# Patient Record
Sex: Female | Born: 1995 | Race: Black or African American | Hispanic: No | Marital: Single | Smoking: Never smoker
Health system: Southern US, Community
[De-identification: ages and names within clinical notes are randomized; demographics above are authoritative.]

## PROBLEM LIST (undated history)

## (undated) DIAGNOSIS — R102 Pelvic and perineal pain: Secondary | ICD-10-CM

## (undated) DIAGNOSIS — Z9889 Other specified postprocedural states: Secondary | ICD-10-CM

## (undated) DIAGNOSIS — Z973 Presence of spectacles and contact lenses: Secondary | ICD-10-CM

## (undated) DIAGNOSIS — Z87820 Personal history of traumatic brain injury: Secondary | ICD-10-CM

## (undated) DIAGNOSIS — N9489 Other specified conditions associated with female genital organs and menstrual cycle: Secondary | ICD-10-CM

## (undated) HISTORY — PX: OTHER SURGICAL HISTORY: SHX169

## (undated) HISTORY — PX: PILONIDAL CYST EXCISION: SHX744

---
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
## 2014-08-27 HISTORY — PX: CYST REMOVAL TRUNK: SHX6283

## 2015-08-11 ENCOUNTER — Emergency Department (HOSPITAL_COMMUNITY)
Admission: EM | Admit: 2015-08-11 | Discharge: 2015-08-11 | Disposition: A | Discharge status: Home / Self Care | Payer: BC Managed Care – PPO | Attending: Emergency Medicine | Admitting: Emergency Medicine

## 2015-08-11 ENCOUNTER — Encounter (HOSPITAL_COMMUNITY): Payer: Self-pay | Admitting: *Deleted

## 2015-08-11 ENCOUNTER — Emergency Department (HOSPITAL_COMMUNITY): Payer: BC Managed Care – PPO

## 2015-08-11 DIAGNOSIS — R55 Syncope and collapse: Secondary | ICD-10-CM | POA: Diagnosis not present

## 2015-08-11 DIAGNOSIS — Z3202 Encounter for pregnancy test, result negative: Secondary | ICD-10-CM | POA: Diagnosis not present

## 2015-08-11 LAB — CBC WITH DIFFERENTIAL/PLATELET
BASOS ABS: 0.1 10*3/uL (ref 0.0–0.1)
BASOS PCT: 1 %
EOS ABS: 0.1 10*3/uL (ref 0.0–0.7)
Eosinophils Relative: 1 %
HCT: 36.7 % (ref 36.0–46.0)
HEMOGLOBIN: 12.3 g/dL (ref 12.0–15.0)
LYMPHS PCT: 25 %
Lymphs Abs: 2.1 10*3/uL (ref 0.7–4.0)
MCH: 31.5 pg (ref 26.0–34.0)
MCHC: 33.5 g/dL (ref 30.0–36.0)
MCV: 93.9 fL (ref 78.0–100.0)
MONO ABS: 0.5 10*3/uL (ref 0.1–1.0)
MONOS PCT: 6 %
NEUTROS ABS: 5.6 10*3/uL (ref 1.7–7.7)
NEUTROS PCT: 67 %
Platelets: 261 10*3/uL (ref 150–400)
RBC: 3.91 MIL/uL (ref 3.87–5.11)
RDW: 12.1 % (ref 11.5–15.5)
WBC: 8.3 10*3/uL (ref 4.0–10.5)

## 2015-08-11 LAB — BASIC METABOLIC PANEL
Anion gap: 10 (ref 5–15)
BUN: 15 mg/dL (ref 6–20)
CALCIUM: 9.6 mg/dL (ref 8.9–10.3)
CO2: 26 mmol/L (ref 22–32)
CREATININE: 1.02 mg/dL — AB (ref 0.44–1.00)
Chloride: 102 mmol/L (ref 101–111)
Glucose, Bld: 88 mg/dL (ref 65–99)
Potassium: 3.6 mmol/L (ref 3.5–5.1)
SODIUM: 138 mmol/L (ref 135–145)

## 2015-08-11 LAB — URINALYSIS, ROUTINE W REFLEX MICROSCOPIC
BILIRUBIN URINE: NEGATIVE
Glucose, UA: NEGATIVE mg/dL
KETONES UR: NEGATIVE mg/dL
Nitrite: NEGATIVE
PROTEIN: NEGATIVE mg/dL
Specific Gravity, Urine: 1.012 (ref 1.005–1.030)
UROBILINOGEN UA: 0.2 mg/dL (ref 0.0–1.0)
pH: 5.5 (ref 5.0–8.0)

## 2015-08-11 LAB — URINE MICROSCOPIC-ADD ON

## 2015-08-11 LAB — POC URINE PREG, ED: PREG TEST UR: NEGATIVE

## 2015-08-11 LAB — CBG MONITORING, ED: Glucose-Capillary: 100 mg/dL — ABNORMAL HIGH (ref 65–99)

## 2015-08-11 MED ORDER — METRONIDAZOLE 500 MG PO TABS
2000.0000 mg | ORAL_TABLET | Freq: Once | ORAL | Status: AC
  Administered 2015-08-11: 2000 mg via ORAL
  Filled 2015-08-11: qty 4

## 2015-08-11 NOTE — ED Notes (Signed)
CBG 100 

## 2015-08-11 NOTE — ED Notes (Signed)
Pt. States she has been \\having  blackouts. Pt. States she remembers everything but cannot remember if she hit her head. Pt. And friends are poor historians

## 2015-08-11 NOTE — Discharge Instructions (Signed)
Syncope Mariah Butler, you need to see a primary care physician within 3 days for close follow-up. If any symptoms worsen come back to the emergency department immediately. Thank you. Syncope means a person passes out (faints). The person usually wakes up in less than 5 minutes. It is important to seek medical care for syncope. HOME CARE  Have someone stay with you until you feel normal.  Do not drive, use machines, or play sports until your doctor says it is okay.  Keep all doctor visits as told.  Lie down when you feel like you might pass out. Take deep breaths. Wait until you feel normal before standing up.  Drink enough fluids to keep your pee (urine) clear or pale yellow.  If you take blood pressure or heart medicine, get up slowly. Take several minutes to sit and then stand. GET HELP RIGHT AWAY IF:   You have a severe headache.  You have pain in the chest, belly (abdomen), or back.  You are bleeding from the mouth or butt (rectum).  You have black or tarry poop (stool).  You have an irregular or very fast heartbeat.  You have pain with breathing.  You keep passing out, or you have shaking (seizures) when you pass out.  You pass out when sitting or lying down.  You feel confused.  You have trouble walking.  You have severe weakness.  You have vision problems. If you fainted, call for help (911 in [place] Do not drive yourself to the hospital.   This information is not intended to replace advice given to you by your health care provider. Make sure you discuss any questions you have with your health care provider.   Document Released: 03/31/2008 Document Revised: 02/27/2015 Document Reviewed: 12/12/2011 Elsevier Interactive Patient Education 2016 [employer]. Trichomoniasis Trichomoniasis is an infection caused by an organism called Trichomonas. The infection can affect both women and men. In women, the outer female genitalia and the vagina are affected. In men, the  penis is mainly affected, but the prostate and other reproductive organs can also be involved. Trichomoniasis is a sexually transmitted infection (STI) and is most often passed to another person through sexual contact.  RISK FACTORS  Having unprotected sexual intercourse.  Having sexual intercourse with an infected partner. SIGNS AND SYMPTOMS  Symptoms of trichomoniasis in women include:  Abnormal gray-green frothy vaginal discharge.  Itching and irritation of the vagina.  Itching and irritation of the area outside the vagina. Symptoms of trichomoniasis in men include:   Penile discharge with or without pain.  Pain during urination. This results from inflammation of the urethra. DIAGNOSIS  Trichomoniasis may be found during a Pap test or physical exam. Your health care provider may use one of the following methods to help diagnose this infection:  Testing the pH of the vagina with a test tape.  Using a vaginal swab test that checks for the Trichomonas organism. A test is available that provides results within a few minutes.  Examining a urine sample.  Testing vaginal secretions. Your health care provider may test you for other STIs, including HIV. TREATMENT   You may be given medicine to fight the infection. Women should inform their health care provider if they could be or are pregnant. Some medicines used to treat the infection should not be taken during pregnancy.  Your health care provider may recommend over-the-counter medicines or creams to decrease itching or irritation.  Your sexual partner will need to be treated if infected.  Your health care provider may test you for infection again 3 months after treatment. HOME CARE INSTRUCTIONS   Take medicines only as directed by your health care provider.  Take over-the-counter medicine for itching or irritation as directed by your health care provider.  Do not have sexual intercourse while you have the infection.  Women  should not douche or wear tampons while they have the infection.  Discuss your infection with your partner. Your partner may have gotten the infection from you, or you may have gotten it from your partner.  Have your sex partner get examined and treated if necessary.  Practice safe, informed, and protected sex.  See your health care provider for other STI testing. SEEK MEDICAL CARE IF:   You still have symptoms after you finish your medicine.  You develop abdominal pain.  You have pain when you urinate.  You have bleeding after sexual intercourse.  You develop a rash.  Your medicine makes you sick or makes you throw up (vomit). MAKE SURE YOU:  Understand these instructions.  Will watch your condition.  Will get help right away if you are not doing well or get worse.   This information is not intended to replace advice given to you by your health care provider. Make sure you discuss any questions you have with your health care provider.   Document Released: 04/08/2001 Document Revised: 11/03/2014 Document Reviewed: 07/25/2013 Elsevier Interactive Patient Education [employer].

## 2015-08-11 NOTE — ED Notes (Signed)
Pt. Left with all belongings and refused wheelchair. Discharge instructions were reviewed and all questions were answered.  

## 2015-08-11 NOTE — ED Provider Notes (Signed)
CSN: [id number]     Arrival date & time 08/11/15  4098 History   First MD Initiated Contact with Patient 08/11/15 0351     Chief Complaint  Patient presents with  . Loss of Consciousness     (Consider location/radiation/quality/duration/timing/severity/associated sxs/prior Treatment) HPI  Mariah Butler is a 19 y.o. female with no significant past medical history presenting today with syncope. Patient states this occurred tonight and has never happened in the past. She describes feeling weird while cooking today. She did not stand up from a sitting position prior to this event. Denies any prodromal pain such as chest pain, abdominal pain, back pain, or shortness of breath. At that time her friend provides the history states she slowly fell backward and caught herself prior to hitting the ground. Friend describes LOC for 40 seconds before coming back to. Patient's and states this occurred 2 more times tonight. She denies feeling ill recently, she is otherwise been in her normal state of health. Patient has no further complaints.  10 Systems reviewed and are negative for acute change except as noted in the HPI.       History reviewed. No pertinent past medical history. Past Surgical History  Procedure Laterality Date  . Cyst removed     History reviewed. No pertinent family history. Social History  Substance Use Topics  . Smoking status: Never Smoker   . Smokeless tobacco: None  . Alcohol Use: No   OB History    No data available     Review of Systems    Allergies  Review of patient's allergies indicates no known allergies.  Home Medications   Prior to Admission medications   Not on File   BP 123/77 mmHg  Pulse 80  Temp(Src) 98 F (36.7 C) (Oral)  Resp 13  Ht 5\' 5"  (1.651 m)  Wt 160 lb (72.576 kg)  BMI 26.63 kg/m2  SpO2 98%  LMP 07/28/2015 Physical Exam  Constitutional: She is oriented to person, place, and time. She appears well-developed and well-nourished.  No distress.  HENT:  Head: Normocephalic and atraumatic.  Nose: Nose normal.  Mouth/Throat: Oropharynx is clear and moist. No oropharyngeal exudate.  Eyes: Conjunctivae and EOM are normal. Pupils are equal, round, and reactive to light. No scleral icterus.  Neck: Normal range of motion. Neck supple. No JVD present. No tracheal deviation present. No thyromegaly present.  Cardiovascular: Normal rate, regular rhythm and normal heart sounds.  Exam reveals no gallop and no friction rub.   No murmur heard. Pulmonary/Chest: Effort normal and breath sounds normal. No respiratory distress. She has no wheezes. She exhibits no tenderness.  Abdominal: Soft. Bowel sounds are normal. She exhibits no distension and no mass. There is no tenderness. There is no rebound and no guarding.  Musculoskeletal: Normal range of motion. She exhibits no edema or tenderness.  Lymphadenopathy:    She has no cervical adenopathy.  Neurological: She is alert and oriented to person, place, and time. No cranial nerve deficit. She exhibits normal muscle tone.  Normal strength and sensation in all extremities. Normal cerebellar testing. Normal gait.  Skin: Skin is warm and dry. No rash noted. No erythema. No pallor.  Nursing note and vitals reviewed.   ED Course  Procedures (including critical care time) Labs Review Labs Reviewed  BASIC METABOLIC PANEL - Abnormal; Notable for the following:    Creatinine, Ser 1.02 (*)    All other components within normal limits  URINALYSIS, ROUTINE W REFLEX MICROSCOPIC (NOT AT [facility]) -  Abnormal; Notable for the following:    APPearance CLOUDY (*)    Hgb urine dipstick SMALL (*)    Leukocytes, UA MODERATE (*)    All other components within normal limits  URINE MICROSCOPIC-ADD ON - Abnormal; Notable for the following:    Squamous Epithelial / LPF FEW (*)    All other components within normal limits  CBG MONITORING, ED - Abnormal; Notable for the following:    Glucose-Capillary 100 (*)     All other components within normal limits  CBC WITH DIFFERENTIAL/PLATELET  POC URINE PREG, ED    Imaging Review No results found. I have personally reviewed and evaluated these images and lab results as part of my medical decision-making.   EKG Interpretation   Date/Time:  Saturday August 11 2015 04:03:14 EDT Ventricular Rate:  80 PR Interval:  140 QRS Duration: 89 QT Interval:  376 QTC Calculation: 434 R Axis:   50 Text Interpretation:  Sinus rhythm Confirmed by Glynn Octave  [phone]) on 08/11/2015 4:36:57 AM      MDM   Final diagnoses:  None   patient presents to the emergency department for evaluation for syncope. Given the history, I am not sure this is a true syncopal episode. Patient is able to guide himself onto the floor. I doubt seizure, there is no incontinence, no postictal state. EKG does not show any cause for syncope. Will also obtain chest x-ray and pregnancy test. She appears well in no acute distress, there is been no recurrence of her symptoms in the emergency department.  ED workup is negative. Urinalysis shows trichomonas, I will treat the patient with 2 g of Flagyl. Her vital signs remain within her normal limits and she is safe for discharge with primary care follow-up.  Everlene Balls, MD 08/11/15 [phone]

## 2015-10-28 DIAGNOSIS — Z9889 Other specified postprocedural states: Secondary | ICD-10-CM

## 2015-10-28 HISTORY — DX: Other specified postprocedural states: Z98.890

## 2016-05-15 ENCOUNTER — Ambulatory Visit (HOSPITAL_COMMUNITY)
Admission: EM | Admit: 2016-05-15 | Discharge: 2016-05-15 | Disposition: A | Discharge status: Home / Self Care | Payer: BC Managed Care – PPO | Attending: Emergency Medicine | Admitting: Emergency Medicine

## 2016-05-15 ENCOUNTER — Encounter (HOSPITAL_COMMUNITY): Payer: Self-pay | Admitting: Family Medicine

## 2016-05-15 DIAGNOSIS — N939 Abnormal uterine and vaginal bleeding, unspecified: Secondary | ICD-10-CM | POA: Insufficient documentation

## 2016-05-15 LAB — POCT PREGNANCY, URINE: PREG TEST UR: NEGATIVE

## 2016-05-15 NOTE — ED Provider Notes (Signed)
CSN: [id number]     Arrival date & time 05/15/16  1440 History   First MD Initiated Contact with Patient 05/15/16 1537     Chief Complaint  Patient presents with  . Vaginal Bleeding   (Consider location/radiation/quality/duration/timing/severity/associated sxs/prior Treatment) HPI Comments: 20 year old female presents today for abnormal uterine bleeding. She states her menses in June was on the sixth and right on time. Her next menses was July 4 with a six-day flow and right on time. She started having what she called heavy vaginal bleeding today. She has had to use a total of 2 tampons in total today. She also states that she has a vaginal discharge which is clear and sticky. Denies pelvic pain or odor. She is currently taking OCPs on a regular basis and has a PCP.   History reviewed. No pertinent past medical history. Past Surgical History  Procedure Laterality Date  . Cyst removed     History reviewed. No pertinent family history. Social History  Substance Use Topics  . Smoking status: Never Smoker   . Smokeless tobacco: None  . Alcohol Use: No   OB History    No data available     Review of Systems  Constitutional: Negative.   Respiratory: Negative.   Gastrointestinal: Negative.   Genitourinary: Positive for vaginal bleeding, vaginal discharge and menstrual problem. Negative for dysuria, urgency, frequency, hematuria, flank pain and pelvic pain.  Musculoskeletal: Negative.   Skin: Negative.   Neurological: Negative.   Psychiatric/Behavioral: Negative.   All other systems reviewed and are negative.   Allergies  Review of patient's allergies indicates no known allergies.  Home Medications   Prior to Admission medications   Not on File   Meds Ordered and Administered this Visit  Medications - No data to display  BP 119/67 mmHg  Pulse 66  Temp(Src) 98.5 F (36.9 C) (Oral)  Resp 16  SpO2 100%  LMP 05/15/2016 No data found.   Physical Exam  Constitutional:  She is oriented to person, place, and time. She appears well-developed and well-nourished. No distress.  Eyes: EOM are normal.  Neck: Normal range of motion. Neck supple.  Cardiovascular: Normal rate.   Pulmonary/Chest: Effort normal. No respiratory distress.  Genitourinary:  Normal external female genitalia. There is a slow flow of a small amount of blood from the cervical os. No other source of bleeding. No lesions are seen. The discharge in which the patient speaks is a clear mucoid fluid that appears to be physiologic spinn. No other abnormal discharge. No erythema or swelling. No cervical tenderness.  Musculoskeletal: She exhibits no edema.  Neurological: She is alert and oriented to person, place, and time. She exhibits normal muscle tone.  Skin: Skin is warm and dry.  Psychiatric: She has a normal mood and affect.  Nursing note and vitals reviewed.   ED Course  Procedures (including critical care time)  Labs Review Labs Reviewed  POCT PREGNANCY, URINE  CERVICOVAGINAL ANCILLARY ONLY   Results for orders placed or performed during the hospital encounter of 05/15/16  Pregnancy, urine POC  Result Value Ref Range   Preg Test, Ur NEGATIVE NEGATIVE     Imaging Review No results found.   Visual Acuity Review  Right Eye Distance:   Left Eye Distance:   Bilateral Distance:    Right Eye Near:   Left Eye Near:    Bilateral Near:         MDM   1. Abnormal uterine bleeding (AUB)    It  is likely that your bleeding is due to an imbalance of hormones. Call your doctor or nurse practitioner tomorrow for advice in regards to how to proceed. Suggestions may include taking more than one tablet a day for a few days to help stop the bleeding or stopping your pills altogether and taking another hormone for a few days. The bleeding may also just stopped on its own without other intervention. Cervical cytology pending.    Janne Napoleon, NP 05/15/16 1644

## 2016-05-15 NOTE — Discharge Instructions (Signed)
Abnormal Uterine Bleeding It is likely that your bleeding is due to an imbalance of hormones. Call your doctor or nurse practitioner tomorrow for advice in regards to how to proceed. Suggestions may include taking more than one tablet a day for a few days to help stop the bleeding or stopping your pills altogether and taking another hormone for a few days. The bleeding may also just stopped on its own without other intervention. Abnormal uterine bleeding can affect women at various stages in life, including teenagers, women in their reproductive years, pregnant women, and women who have reached menopause. Several kinds of uterine bleeding are considered abnormal, including:  Bleeding or spotting between periods.   Bleeding after sexual intercourse.   Bleeding that is heavier or more than normal.   Periods that last longer than usual.  Bleeding after menopause.  Many cases of abnormal uterine bleeding are minor and simple to treat, while others are more serious. Any type of abnormal bleeding should be evaluated by your health care provider. Treatment will depend on the cause of the bleeding. HOME CARE INSTRUCTIONS Monitor your condition for any changes. The following actions may help to alleviate any discomfort you are experiencing:  Avoid the use of tampons and douches as directed by your health care provider.  Change your pads frequently. You should get regular pelvic exams and Pap tests. Keep all follow-up appointments for diagnostic tests as directed by your health care provider.  SEEK MEDICAL CARE IF:   Your bleeding lasts more than 1 week.   You feel dizzy at times.  SEEK IMMEDIATE MEDICAL CARE IF:   You pass out.   You are changing pads every 15 to 30 minutes.   You have abdominal pain.  You have a fever.   You become sweaty or weak.   You are passing large blood clots from the vagina.   You start to feel nauseous and vomit. MAKE SURE YOU:   Understand  these instructions.  Will watch your condition.  Will get help right away if you are not doing well or get worse.   This information is not intended to replace advice given to you by your health care provider. Make sure you discuss any questions you have with your health care provider.   Document Released: 10/13/2005 Document Revised: 10/18/2013 Document Reviewed: 05/12/2013 Elsevier Interactive Patient Education [employer].

## 2016-05-15 NOTE — ED Notes (Signed)
Pt here for having an extra period this month. sts she had bleeding from the 4th to the 10th with cramping and now she is on her period again which is her normal cycle. Denies any pain now.

## 2016-05-16 LAB — CERVICOVAGINAL ANCILLARY ONLY
CHLAMYDIA, DNA PROBE: NEGATIVE
Neisseria Gonorrhea: NEGATIVE

## 2016-05-19 LAB — CERVICOVAGINAL ANCILLARY ONLY: Wet Prep (BD Affirm): POSITIVE — AB

## 2016-05-20 ENCOUNTER — Telehealth (HOSPITAL_COMMUNITY): Payer: Self-pay | Admitting: *Deleted

## 2016-05-20 ENCOUNTER — Telehealth: Payer: Self-pay | Admitting: Internal Medicine

## 2016-05-20 NOTE — Telephone Encounter (Signed)
Clinical staff, please let patient know that test for gardnerella (bacterial vaginosis) was positive.   Would send rx for metronidazole 500mg  bid x 7d, #14, no refills, to pharmacy.   Tests for gonorrhea/chlamydia were negative.  Recheck as needed if symptoms persist.  LM

## 2016-05-28 ENCOUNTER — Ambulatory Visit (HOSPITAL_COMMUNITY)
Admission: EM | Admit: 2016-05-28 | Discharge: 2016-05-28 | Disposition: A | Discharge status: Home / Self Care | Payer: BC Managed Care – PPO | Attending: Family Medicine | Admitting: Family Medicine

## 2016-05-28 ENCOUNTER — Encounter (HOSPITAL_COMMUNITY): Payer: Self-pay | Admitting: Nurse Practitioner

## 2016-05-28 DIAGNOSIS — A499 Bacterial infection, unspecified: Secondary | ICD-10-CM

## 2016-05-28 DIAGNOSIS — N760 Acute vaginitis: Secondary | ICD-10-CM | POA: Diagnosis not present

## 2016-05-28 DIAGNOSIS — B9689 Other specified bacterial agents as the cause of diseases classified elsewhere: Secondary | ICD-10-CM

## 2016-05-28 MED ORDER — METRONIDAZOLE 500 MG PO TABS: 500.0000 mg | ORAL_TABLET | Freq: Two times a day (BID) | ORAL | 0 refills | Status: AC

## 2016-05-28 NOTE — ED Triage Notes (Signed)
Pt states she is here for follow-up. She received a message in her Mychart account instructing her to come here for treatment of abnormal test results on her recent visit here for abnormal vaginal bleeding and discharge. Reports she continues to have abnormal vaginal discharge and pelvic cramps. She is alert and breathing easily

## 2016-05-29 NOTE — ED Provider Notes (Signed)
CSN: [id number]     Arrival date & time 05/28/16  1446 History   First MD Initiated Contact with Patient 05/29/16 1545     Chief Complaint  Patient presents with  . Follow-up   (Consider location/radiation/quality/duration/timing/severity/associated sxs/prior Treatment) Patient presents today to urgent care because she got a message through her Mychart Account instructing her to come due to an abnormal cervical cytology result. She was last seem on 05/15/16 here for vaginal discharge. Wet prep resulted on the 07/25 positive for Gardnerella (BV). A flagyl prescription was supposed to be called in but patient states she never got the prescription and when she called the Urgent care, she was instructed to come in for a visit. She continues to have clear white vaginal discharge.       History reviewed. No pertinent past medical history. Past Surgical History:  Procedure Laterality Date  . cyst removed     History reviewed. No pertinent family history. Social History  Substance Use Topics  . Smoking status: Never Smoker  . Smokeless tobacco: Never Used  . Alcohol use No   OB History    No data available     Review of Systems  Constitutional: Negative for chills, fatigue and fever.  Respiratory: Negative.   Cardiovascular: Negative.   Genitourinary: Positive for vaginal discharge. Negative for dysuria, vaginal bleeding and vaginal pain.    Allergies  Review of patient's allergies indicates no known allergies.  Home Medications   Prior to Admission medications   Medication Sig Start Date End Date Taking? Authorizing Provider  norethindrone-ethinyl estradiol (MICROGESTIN,JUNEL,LOESTRIN) 1-20 MG-MCG tablet Take 1 tablet by mouth daily.   Yes Historical Provider, MD  metroNIDAZOLE (FLAGYL) 500 MG tablet Take 1 tablet (500 mg total) by mouth 2 (two) times daily. 05/28/16 06/04/16  Barry Dienes, NP   Meds Ordered and Administered this Visit  Medications - No data to display  BP 107/63  (BP Location: Left Arm)   Pulse 62   Temp 98.8 F (37.1 C) (Oral)   Resp 18   Ht 5\' 5"  (1.651 m)   Wt 162 lb (73.5 kg)   LMP 04/29/2016   SpO2 100%   BMI 26.96 kg/m  No data found.   Physical Exam  Constitutional: She is oriented to person, place, and time. She appears well-developed and well-nourished.  Cardiovascular: Normal rate, regular rhythm and normal heart sounds.   Pulmonary/Chest: Effort normal and breath sounds normal.  Neurological: She is alert and oriented to person, place, and time.  Skin: Skin is warm and dry.    Urgent Care Course   Clinical Course    Procedures (including critical care time)  Labs Review Labs Reviewed - No data to display  Imaging Review No results found.   Visual Acuity Review  Right Eye Distance:   Left Eye Distance:   Bilateral Distance:    Right Eye Near:   Left Eye Near:    Bilateral Near:         MDM   1. Bacterial vaginosis    Patient does have a positive BV (positive for Gardnerella). Flagyl prescription given for 500mg  BID x 7 days. Instructed to follow up with PCP if she does not improve.    Barry Dienes, NP 05/29/16 [place], NP 05/29/16 1229

## 2017-02-20 ENCOUNTER — Encounter (HOSPITAL_COMMUNITY): Payer: Self-pay | Admitting: Emergency Medicine

## 2017-02-20 ENCOUNTER — Emergency Department (HOSPITAL_COMMUNITY): Payer: BC Managed Care – PPO

## 2017-02-20 ENCOUNTER — Emergency Department (HOSPITAL_COMMUNITY)
Admission: EM | Admit: 2017-02-20 | Discharge: 2017-02-20 | Disposition: A | Discharge status: Home / Self Care | Payer: BC Managed Care – PPO | Attending: Emergency Medicine | Admitting: Emergency Medicine

## 2017-02-20 DIAGNOSIS — N83202 Unspecified ovarian cyst, left side: Secondary | ICD-10-CM | POA: Insufficient documentation

## 2017-02-20 DIAGNOSIS — R1084 Generalized abdominal pain: Secondary | ICD-10-CM | POA: Diagnosis present

## 2017-02-20 DIAGNOSIS — Z79899 Other long term (current) drug therapy: Secondary | ICD-10-CM | POA: Insufficient documentation

## 2017-02-20 DIAGNOSIS — N83201 Unspecified ovarian cyst, right side: Secondary | ICD-10-CM | POA: Insufficient documentation

## 2017-02-20 DIAGNOSIS — D369 Benign neoplasm, unspecified site: Secondary | ICD-10-CM

## 2017-02-20 DIAGNOSIS — R1033 Periumbilical pain: Secondary | ICD-10-CM

## 2017-02-20 LAB — COMPREHENSIVE METABOLIC PANEL
ALBUMIN: 3.8 g/dL (ref 3.5–5.0)
ALK PHOS: 32 U/L — AB (ref 38–126)
ALT: 13 U/L — ABNORMAL LOW (ref 14–54)
ANION GAP: 12 (ref 5–15)
AST: 25 U/L (ref 15–41)
BUN: 16 mg/dL (ref 6–20)
CALCIUM: 9.3 mg/dL (ref 8.9–10.3)
CHLORIDE: 104 mmol/L (ref 101–111)
CO2: 22 mmol/L (ref 22–32)
Creatinine, Ser: 0.9 mg/dL (ref 0.44–1.00)
GFR calc Af Amer: >60 mL/min (ref >60)
GFR calc non Af Amer: >60 mL/min (ref >60)
GLUCOSE: 100 mg/dL — AB (ref 65–99)
POTASSIUM: 3.7 mmol/L (ref 3.5–5.1)
SODIUM: 138 mmol/L (ref 135–145)
Total Bilirubin: 0.8 mg/dL (ref 0.3–1.2)
Total Protein: 8 g/dL (ref 6.5–8.1)

## 2017-02-20 LAB — URINALYSIS, ROUTINE W REFLEX MICROSCOPIC
BILIRUBIN URINE: NEGATIVE
GLUCOSE, UA: NEGATIVE mg/dL
HGB URINE DIPSTICK: NEGATIVE
Ketones, ur: 20 mg/dL — AB
Leukocytes, UA: NEGATIVE
Nitrite: NEGATIVE
Protein, ur: NEGATIVE mg/dL
SPECIFIC GRAVITY, URINE: 1.031 — AB (ref 1.005–1.030)
pH: 6 (ref 5.0–8.0)

## 2017-02-20 LAB — I-STAT BETA HCG BLOOD, ED (MC, WL, AP ONLY): I-stat hCG, quantitative: <5 m[IU]/mL (ref <5)

## 2017-02-20 LAB — WET PREP, GENITAL
Sperm: NONE SEEN
TRICH WET PREP: NONE SEEN
YEAST WET PREP: NONE SEEN

## 2017-02-20 LAB — CBC
HEMATOCRIT: 37 % (ref 36.0–46.0)
HEMOGLOBIN: 13 g/dL (ref 12.0–15.0)
MCH: 32.8 pg (ref 26.0–34.0)
MCHC: 35.1 g/dL (ref 30.0–36.0)
MCV: 93.4 fL (ref 78.0–100.0)
Platelets: 284 10*3/uL (ref 150–400)
RBC: 3.96 MIL/uL (ref 3.87–5.11)
RDW: 11.9 % (ref 11.5–15.5)
WBC: 7.3 10*3/uL (ref 4.0–10.5)

## 2017-02-20 MED ORDER — ONDANSETRON HCL 4 MG/2ML IJ SOLN
4.0000 mg | Freq: Once | INTRAMUSCULAR | Status: AC
  Administered 2017-02-20: 4 mg via INTRAVENOUS
  Filled 2017-02-20: qty 2

## 2017-02-20 MED ORDER — IOPAMIDOL (ISOVUE-300) INJECTION 61%
INTRAVENOUS | Status: AC
  Administered 2017-02-20: 100 mL
  Filled 2017-02-20: qty 100

## 2017-02-20 MED ORDER — MORPHINE SULFATE (PF) 4 MG/ML IV SOLN
4.0000 mg | Freq: Once | INTRAVENOUS | Status: AC
  Administered 2017-02-20: 4 mg via INTRAVENOUS
  Filled 2017-02-20: qty 1

## 2017-02-20 MED ORDER — TRAMADOL HCL 50 MG PO TABS: 50.0000 mg | ORAL_TABLET | Freq: Four times a day (QID) | ORAL | 0 refills | Status: DC | PRN

## 2017-02-20 MED ORDER — KETOROLAC TROMETHAMINE 30 MG/ML IJ SOLN
30.0000 mg | Freq: Once | INTRAMUSCULAR | Status: AC
  Administered 2017-02-20: 30 mg via INTRAVENOUS
  Filled 2017-02-20: qty 1

## 2017-02-20 MED ORDER — SODIUM CHLORIDE 0.9 % IV BOLUS (SEPSIS)
500.0000 mL | Freq: Once | INTRAVENOUS | Status: AC
  Administered 2017-02-20: 500 mL via INTRAVENOUS

## 2017-02-20 NOTE — ED Notes (Signed)
US in process

## 2017-02-20 NOTE — ED Notes (Signed)
Pt verbalizes "pain is much better just hurts with certain movements."

## 2017-02-20 NOTE — ED Notes (Signed)
Pt transported to CT ?

## 2017-02-20 NOTE — Discharge Instructions (Signed)
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  Your ultrasound confirmed this and did not show ovarian torsion or decreased blood flow to your ovaries.  The pain you are experiencing is most likely from these dermoids. As a reminder, your gonorrhea and chlamydia testing are still pending.    Here are your CT scan and ultrasound findings: IMPRESSION:  Normal appendix.     Bilateral ovarian dermoids, left larger than right.     Small amount of free fluid in the pelvis, likely physiologic.   IMPRESSION:  1. Current Doppler assessment shows no compelling findings of  ovarian torsion. Arterial and venous waveforms are present in the  ovarian parenchyma at the time of scanning.  2. There is a small amount of free pelvic fluid.  3. Large hyperechoic left ovarian mass corresponding to the dermoid  seen at CT.  4. Two hyperechoic right ovarian lesions, 1 of which likely  represents the right ovarian dermoid seen at CT. I am not certain  about the second hyperechoic area on the right. This could be an  adjacent loop of bowel simulating a second lesion. I note that the  beta HCG level is normal, and accordingly ectopic pregnancy is not  suspected as a cause for the second area of hyperechogenicity.  Conceivably complex fluid in the right fallopian tube could cause a  similar appearance.    For pain: If your pain is mild you can take ibuprofen 600 mg every 6-8 hours or tylenol 1000 mg every 6-8 hours, separately.   If your pain is moderate you can take ibuprofen 600 mg + tylenol 1000 mg combined every 6-8 hours.  If your pain is severe you may take ibuprofen 600 mg + tylenol 1000 mg + tramadol 50 mg as prescribed  Contact the [facility] today and make an appointment as soon as possible for further discussion of your CT and ultrasound results, they may recommend more treatment or interventions.   Return to the emergency department if your abdominal pain  is severe or worsens and/or if it is associated with nausea, vomiting, vaginal bleeding.

## 2017-02-20 NOTE — ED Notes (Signed)
Pt back from CT; resting at present time with lights dimmed.

## 2017-02-20 NOTE — ED Triage Notes (Signed)
Per [facility] , pt.from her apartment with complaint of mid lower abdominal pain at 9/10 which started at 12 midnight. Pt. Also claimed of nausea , denied vomiting. Pt. Stated  her latest period was  a week and half ago but she still having bleeding up to this time. Pt. Stated that she's on birth control pill . Denied diarrhea.

## 2017-02-20 NOTE — ED Provider Notes (Signed)
[facility] DEPT Provider Note   CSN: [id number] Arrival date & time: 02/20/17  1540  History   Chief Complaint Chief Complaint  Patient presents with  . Abdominal Pain    HPI Mariah Butler is a 21 y.o. female with no pertinent pmh presents to ED with sudden onset, constant, "cramp" like generalized abdominal pain that radiates to the mid lower back associated with nausea that started last night during sleep.  LMP was 02/10/17, but patient notes prolonged painless vaginal spotting until yesterday.  Menstrual cycle usually lasts 4 days, and this month patient had vaginal spotting for 1.5 weeks.  Patient is on oral birth control pills and denies missed doses recently. Patient is sexually active with men only and reports consistent use of condom 100% of the time. No new sexual partners.  Patient denies previously diagnosed STD, last testing was December 2017 and she reports testing was negative.  Patient denies fevers, CP, SOB, vomiting, dysuria, hematuria, vaginal discharge or irritation, diarrhea or constipation.  Patient reports feeling well and at baseline yesterday.    Per chart review, patient had a GYN annual exam on December 2017 at PCP office. Her STD testing at that time was positive for chlamydia, negative for gonorrhea, syphilis and HIV. Patient was not aware of +chlamydia at that time.   HPI  History reviewed. No pertinent past medical history.  There are no active problems to display for this patient.   Past Surgical History:  Procedure Laterality Date  . cyst removed      OB History    No data available       Home Medications    Prior to Admission medications   Medication Sig Start Date End Date Taking? Authorizing Provider  ibuprofen (ADVIL,MOTRIN) 200 MG tablet Take 200 mg by mouth every 6 (six) hours as needed (pain, cramps).   Yes Historical Provider, MD  norethindrone-ethinyl estradiol (MICROGESTIN,JUNEL,LOESTRIN) 1-20 MG-MCG tablet Take 1 tablet by mouth  daily.   Yes Historical Provider, MD  traMADol (ULTRAM) 50 MG tablet Take 1 tablet (50 mg total) by mouth every 6 (six) hours as needed. 02/20/17   Kinnie Feil, PA-C    Family History History reviewed. No pertinent family history.  Social History Social History  Substance Use Topics  . Smoking status: Never Smoker  . Smokeless tobacco: Never Used  . Alcohol use No     Allergies   Patient has no known allergies.   Review of Systems Review of Systems  Constitutional: Negative for chills and fever.  HENT: Negative for congestion and sore throat.   Respiratory: Negative for cough and shortness of breath.   Cardiovascular: Negative for chest pain and palpitations.  Gastrointestinal: Positive for abdominal pain and nausea. Negative for blood in stool, constipation, diarrhea and vomiting.  Genitourinary: Positive for menstrual problem, pelvic pain and vaginal bleeding. Negative for decreased urine volume, difficulty urinating, dysuria, flank pain, frequency, hematuria, vaginal discharge and vaginal pain.  Musculoskeletal: Positive for back pain (mid lower back).  Skin: Positive for rash.  Neurological: Negative for light-headedness and headaches.     Physical Exam Updated Vital Signs BP 115/83 (BP Location: Right Arm)   Pulse (!) 57   Temp 98.3 F (36.8 C) (Oral)   Resp 12   LMP 02/09/2017 Comment: pt. on birth control pill  SpO2 100%   Physical Exam  Constitutional: She is oriented to person, place, and time. She appears well-developed and well-nourished.  Teary eyed, reports 9/10 abdominal pain  HENT:  Head: Normocephalic and atraumatic.  Nose: Nose normal.  Mouth/Throat: Oropharynx is clear and moist. No oropharyngeal exudate.  Moist mucous membranes  Eyes: Conjunctivae are normal. Pupils are equal, round, and reactive to light.  Neck: Normal range of motion. Neck supple.  Cardiovascular: Normal rate, regular rhythm, normal heart sounds and intact distal pulses.    No murmur heard. Pulmonary/Chest: Effort normal and breath sounds normal. No respiratory distress. She has no wheezes. She has no rales. She exhibits no tenderness.  Abdominal: Soft. Bowel sounds are normal. She exhibits no distension and no mass. There is tenderness. There is no rebound and no guarding.  +Suprapubic and periumbilical abdominal tenderness No guarding, distention, rebound or rigidity No signs of peritonitis  No CVAT bilaterally Negative McBurney's Negative Murphy's  Negative Psoas's Negative' Obturator's  Genitourinary: Cervix exhibits motion tenderness.  Genitourinary Comments:  +Scant light yellow, think cervical discharge +CMT External genitalia normal without erythema, edema, tenderness, discharge or lesions.  No groin lymphadenopathy.  Vaginal mucosa and cervix normal, pink without lesions.  Non palpable adnexa  Musculoskeletal: Normal range of motion. She exhibits no deformity.  Lymphadenopathy:    She has no cervical adenopathy.  Neurological: She is alert and oriented to person, place, and time. No sensory deficit.  Skin: Skin is warm and dry. Capillary refill takes less than 2 seconds.  Psychiatric: She has a normal mood and affect. Her behavior is normal. Judgment and thought content normal.  Nursing note and vitals reviewed.    ED Treatments / Results  Labs (all labs ordered are listed, but only abnormal results are displayed) Labs Reviewed  WET PREP, GENITAL - Abnormal; Notable for the following:       Result Value   Clue Cells Wet Prep HPF POC PRESENT (*)    WBC, Wet Prep HPF POC MANY (*)    All other components within normal limits  COMPREHENSIVE METABOLIC PANEL - Abnormal; Notable for the following:    Glucose, Bld 100 (*)    ALT 13 (*)    Alkaline Phosphatase 32 (*)    All other components within normal limits  URINALYSIS, ROUTINE W REFLEX MICROSCOPIC - Abnormal; Notable for the following:    APPearance HAZY (*)    Specific Gravity,  Urine 1.031 (*)    Ketones, ur 20 (*)    All other components within normal limits  CBC  I-STAT BETA HCG BLOOD, ED ([facility], [facility], [facility] ONLY)  GC/CHLAMYDIA PROBE AMP ([facility]) NOT AT [facility]    EKG  EKG Interpretation None       Radiology US Transvaginal Non-ob  Result Date: 02/20/2017 CLINICAL DATA:  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 Pelvic pain. EXAM: TRANSABDOMINAL AND TRANSVAGINAL ULTRASOUND OF PELVIS DOPPLER ULTRASOUND OF OVARIES TECHNIQUE: Both transabdominal and transvaginal ultrasound examinations of the pelvis were performed. Transabdominal technique was performed for global imaging of the pelvis including uterus, ovaries, adnexal regions, and pelvic cul-de-sac. It was necessary to proceed with endovaginal exam following the transabdominal exam to visualize the uterus, endometrium, and ovaries. Color and duplex Doppler ultrasound was utilized to evaluate blood flow to the ovaries. COMPARISON:  02/20/2017 CT scan FINDINGS: Uterus Measurements: 8.2 by 4.5 by 4.9 cm. No fibroids or other mass visualized. Endometrium Thickness: 6 mm.  No focal abnormality visualized. Right ovary Measurements: 7.3 by 3.4 by 4.0 cm. There are 2 hyperechoic areas along the right ovary, 1 measuring 3.3 by 2.8 by 4.0 cm and the other 2.2 by 3.1 by 2.7 cm.  Left ovary Measurements: 7.7 by 7.1 by 8.0 cm. The left ovary is primarily made up of a large hyperechoic lesion compatible with the dermoid shown on CT. Pulsed Doppler evaluation of both ovaries demonstrates normal low-resistance arterial and venous waveforms within the recognizable ovarian parenchyma. Other findings Small amount of free pelvic fluid. IMPRESSION: 1. Current Doppler assessment shows no compelling findings of ovarian torsion. Arterial and venous waveforms are present in the ovarian parenchyma at the time of scanning. 2. There is a small amount of free pelvic fluid. 3. Large hyperechoic left ovarian mass corresponding to the  dermoid seen at CT. 4. Two hyperechoic right ovarian lesions, 1 of which likely represents the right ovarian dermoid seen at CT. I am not certain about the second hyperechoic area on the right. This could be an adjacent loop of bowel simulating a second lesion. I note that the beta HCG level is normal, and accordingly ectopic pregnancy is not suspected as a cause for the second area of hyperechogenicity. Conceivably complex fluid in the right fallopian tube could cause a similar appearance. Electronically Signed   By: Van Clines M.D.   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 Pelvic pain. EXAM: TRANSABDOMINAL AND TRANSVAGINAL ULTRASOUND OF PELVIS DOPPLER ULTRASOUND OF OVARIES TECHNIQUE: Both transabdominal and transvaginal ultrasound examinations of the pelvis were performed. Transabdominal technique was performed for global imaging of the pelvis including uterus, ovaries, adnexal regions, and pelvic cul-de-sac. It was necessary to proceed with endovaginal exam following the transabdominal exam to visualize the uterus, endometrium, and ovaries. Color and duplex Doppler ultrasound was utilized to evaluate blood flow to the ovaries. COMPARISON:  02/20/2017 CT scan FINDINGS: Uterus Measurements: 8.2 by 4.5 by 4.9 cm. No fibroids or other mass visualized. Endometrium Thickness: 6 mm.  No focal abnormality visualized. Right ovary Measurements: 7.3 by 3.4 by 4.0 cm. There are 2 hyperechoic areas along the right ovary, 1 measuring 3.3 by 2.8 by 4.0 cm and the other 2.2 by 3.1 by 2.7 cm. Left ovary Measurements: 7.7 by 7.1 by 8.0 cm. The left ovary is primarily made up of a large hyperechoic lesion compatible with the dermoid shown on CT. Pulsed Doppler evaluation of both ovaries demonstrates normal low-resistance arterial and venous waveforms within the recognizable ovarian parenchyma. Other findings Small  amount of free pelvic fluid. IMPRESSION: 1. Current Doppler assessment shows no compelling findings of ovarian torsion. Arterial and venous waveforms are present in the ovarian parenchyma at the time of scanning. 2. There is a small amount of free pelvic fluid. 3. Large hyperechoic left ovarian mass corresponding to the dermoid seen at CT. 4. Two hyperechoic right ovarian lesions, 1 of which likely represents the right ovarian dermoid seen at CT. I am not certain about the second hyperechoic area on the right. This could be an adjacent loop of bowel simulating a second lesion. I note that the beta HCG level is normal, and accordingly ectopic pregnancy is not suspected as a cause for the second area of hyperechogenicity. Conceivably complex fluid in the right fallopian tube could cause a similar appearance. Electronically Signed   By: Van Clines M.D.   On: 02/20/2017 13:02   Ct Abdomen Pelvis W Contrast  Result Date: 02/20/2017 CLINICAL DATA:  Periumbilical abdominal pain, nausea EXAM: CT ABDOMEN AND PELVIS WITH CONTRAST TECHNIQUE: Multidetector CT imaging of the abdomen and pelvis was performed using the standard protocol following  bolus administration of intravenous contrast. CONTRAST:  <See Chart> ISOVUE-300 IOPAMIDOL (ISOVUE-300) INJECTION 61% COMPARISON:  None. FINDINGS: Lower chest: Lung bases are clear. No effusions. Heart is normal size. Hepatobiliary: No focal hepatic abnormality. Gallbladder unremarkable. Pancreas: No focal abnormality or ductal dilatation. Spleen: No focal abnormality.  Normal size. Adrenals/Urinary Tract: No adrenal abnormality. No focal renal abnormality. No stones or hydronephrosis. Urinary bladder is unremarkable. Stomach/Bowel: Appendix is normal. Moderate stool burden in the cecum and ascending colon. Stomach, large and small bowel grossly unremarkable. Vascular/Lymphatic: No evidence of aneurysm or adenopathy. Reproductive: Uterus is unremarkable. Large fatty mass noted  in the left ovary with internal soft tissue and calcifications compatible with dermoid. This measures 7.7 x 4.9 cm Smaller fat containing dermoid noted in the right ovary measuring 2.3 x 2.1 cm. Other: Small amount of free fluid in the pelvis.  No free air. Musculoskeletal: No acute bony abnormality. IMPRESSION: Normal appendix. Bilateral ovarian dermoids, left larger than right. Small amount of free fluid in the pelvis, likely physiologic. Electronically Signed   By: Rolm Baptise M.D.   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 Pelvic pain. EXAM: TRANSABDOMINAL AND TRANSVAGINAL ULTRASOUND OF PELVIS DOPPLER ULTRASOUND OF OVARIES TECHNIQUE: Both transabdominal and transvaginal ultrasound examinations of the pelvis were performed. Transabdominal technique was performed for global imaging of the pelvis including uterus, ovaries, adnexal regions, and pelvic cul-de-sac. It was necessary to proceed with endovaginal exam following the transabdominal exam to visualize the uterus, endometrium, and ovaries. Color and duplex Doppler ultrasound was utilized to evaluate blood flow to the ovaries. COMPARISON:  02/20/2017 CT scan FINDINGS: Uterus Measurements: 8.2 by 4.5 by 4.9 cm. No fibroids or other mass visualized. Endometrium Thickness: 6 mm.  No focal abnormality visualized. Right ovary Measurements: 7.3 by 3.4 by 4.0 cm. There are 2 hyperechoic areas along the right ovary, 1 measuring 3.3 by 2.8 by 4.0 cm and the other 2.2 by 3.1 by 2.7 cm. Left ovary Measurements: 7.7 by 7.1 by 8.0 cm. The left ovary is primarily made up of a large hyperechoic lesion compatible with the dermoid shown on CT. Pulsed Doppler evaluation of both ovaries demonstrates normal low-resistance arterial and venous waveforms within the recognizable ovarian parenchyma. Other findings Small amount of free pelvic fluid. IMPRESSION: 1.  Current Doppler assessment shows no compelling findings of ovarian torsion. Arterial and venous waveforms are present in the ovarian parenchyma at the time of scanning. 2. There is a small amount of free pelvic fluid. 3. Large hyperechoic left ovarian mass corresponding to the dermoid seen at CT. 4. Two hyperechoic right ovarian lesions, 1 of which likely represents the right ovarian dermoid seen at CT. I am not certain about the second hyperechoic area on the right. This could be an adjacent loop of bowel simulating a second lesion. I note that the beta HCG level is normal, and accordingly ectopic pregnancy is not suspected as a cause for the second area of hyperechogenicity. Conceivably complex fluid in the right fallopian tube could cause a similar appearance. Electronically Signed   By: Van Clines M.D.   On: 02/20/2017 13:02    Procedures Procedures (including critical care time)  Medications Ordered in ED Medications  sodium chloride 0.9 % bolus 500 mL (0 mLs Intravenous Stopped 02/20/17 0830)  ondansetron (ZOFRAN) injection 4 mg (4 mg Intravenous Given 02/20/17 0803)  ketorolac (TORADOL) 30 MG/ML injection 30  mg (30 mg Intravenous Given 02/20/17 0803)  morphine 4 MG/ML injection 4 mg (4 mg Intravenous Given 02/20/17 0855)  iopamidol (ISOVUE-300) 61 % injection (100 mLs  Contrast Given 02/20/17 1014)     Initial Impression / Assessment and Plan / ED Course  I have reviewed the triage vital signs and the nursing notes.  Pertinent labs & imaging results that were available during my care of the patient were reviewed by me and considered in my medical decision making (see chart for details).  Clinical Course as of Feb 20 1405  Fri Feb 20, 2017  1056 CT Abdomen Pelvis W Contrast [CG]  1057 Temp: 98.3 F (36.8 C) [CG]  1057 Pulse Rate: 83 [CG]  1057 BP: 116/80 [CG]  1057 Resp: 18 [CG]  1057 SpO2: 100 % [CG]  1057 I-stat hCG, quantitative: <5.0 [CG]  1057 WBC: 7.3 [CG]  1057  Hemoglobin: 13.0 [CG]  1057 Nitrite: NEGATIVE [CG]  1057 Leukocytes, UA: NEGATIVE [CG]  1057 Creatinine: 0.90 [CG]  1057 AST: 25 [CG]  1057 ALT: (!) 13 [CG]  1057 Alkaline Phosphatase: (!) 32 [CG]  1057 Yeast Wet Prep HPF POC: NONE SEEN [CG]  1057 Trich, Wet Prep: NONE SEEN [CG]  1057 Clue Cells Wet Prep HPF POC: (!) PRESENT [CG]  1057 IMPRESSION: Normal appendix.  Bilateral ovarian dermoids, left larger than right.  Small amount of free fluid in the pelvis, likely physiologic. CT Abdomen Pelvis W Contrast [CG]  4580 Reproductive: Uterus is unremarkable. Large fatty mass noted in the left ovary with internal soft tissue and calcifications compatible with dermoid. This measures 7.7 x 4.9 cm Smaller fat containing dermoid noted in the right ovary measuring 2.3 x 2.1 cm. CT Abdomen Pelvis W Contrast [CG]  1325 IMPRESSION: 1. Current Doppler assessment shows no compelling findings of ovarian torsion. Arterial and venous waveforms are present in the ovarian parenchyma at the time of scanning. 2. There is a small amount of free pelvic fluid. 3. Large hyperechoic left ovarian mass corresponding to the dermoid seen at CT. 4. Two hyperechoic right ovarian lesions, 1 of which likely represents the right ovarian dermoid seen at CT. I am not certain about the second hyperechoic area on the right. This could be an adjacent loop of bowel simulating a second lesion. I note that the beta HCG level is normal, and accordingly ectopic pregnancy is not suspected as a cause for the second area of hyperechogenicity. Conceivably complex fluid in the right fallopian tube could cause a similar appearance.  [CG]    Clinical Course User Index [CG] Kinnie Feil, PA-C   21 yo female with no pertinent pmh presents to ED with sudden onset, constant periumbilical abdominal pain that radiates to the left flank associated with nausea that started last night while sleeping.  Associated prolonged menstrual cycle,  ended last night (lasted 10 days).  Pain worse when laying flat and laying on left side. No urinary symptoms.  Initially patient reported 10/10 abdominal pain.  On initial exam patient is teary eyed due to pain, there is suprapubic and LLQ abdominal pain, negative CVAT.  Pelvic exam remarkable for CMT, although patient was very sensitive to pelvic exam.  Initial differential diagnosis includes UTI, pyelo, nephrolithiasis, appendicitis and less likely ovarian torsion, TOA, STD or pregnancy.  Possibly ovarian or uterine masses.    CBC unremarkable.  CMP unremarkable.  U/A without nitrites, leukocytes or blood.  Wet prep +clue cells, possibly BV but patient is asymptomatic.  Hcg negative. Repeat  abdominal exam improved, patient reports 8/10 pain, worsen with changes in position and intermittent.  Will get CT scan and U/S as she is describing LLQ, left flank and suprapubic tenderness.  Would like to r/o torsion, abscess and GI etiology possibly appendicitis.   CT scan and ultrasounds revealed bilateral ovarian dermoids, left larger than right.  U/S confirmed this and showed adequate blood flow and no torsion. Abdominal exam significantly improved prior to discharge and after toradol x 1 and morphine x 1 in ED.  Patient tolerating PO fluids without emesis.  Discussed CT and u/s findings with patient.  Patient considered safe for discharge at this time.  [facility] and [facility].  Patient verbalized understanding.  She is aware of red flag symptoms to monitor for that would warrant return to ED.    Final Clinical Impressions(s) / ED Diagnoses   Final diagnoses:  Periumbilical abdominal pain  Dermoid  Ovarian cyst, bilateral    New Prescriptions Discharge Medication List as of 02/20/2017  1:35 PM    START taking these medications   Details  traMADol (ULTRAM) 50 MG tablet Take 1 tablet (50 mg total) by mouth every 6 (six) hours as needed., Starting Fri 02/20/2017,  Print         Kinnie Feil, PA-C 02/20/17 1406    Lacretia Leigh, MD 02/21/17 [phone]

## 2017-02-23 LAB — GC/CHLAMYDIA PROBE AMP (~~LOC~~) NOT AT ARMC
Chlamydia: NEGATIVE
NEISSERIA GONORRHEA: NEGATIVE

## 2018-03-20 IMAGING — CT CT ABD-PELV W/ CM
2 of 4 series · 16 of 46 positions shown, 18 images · IV contrast (ISOVUE)
Comparison: None.

CLINICAL DATA: Periumbilical abdominal pain, nausea

EXAM:
CT ABDOMEN AND PELVIS WITH CONTRAST
TECHNIQUE: Multidetector CT imaging of the abdomen and pelvis was performed
using the standard protocol following bolus administration of
intravenous contrast.
CONTRAST:  <See Chart> H9YTUJ-EGG IOPAMIDOL (H9YTUJ-EGG) INJECTION
61%

[Series 2: abd/pel with · axial · 0.73mm/px · z∈[+990,+1385]mm · 13 of 89 slices shown, 15 images]
[im 5/89  soft-tissue]
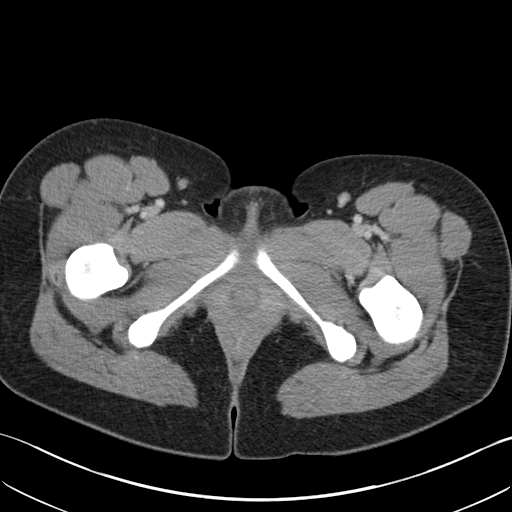
[im 5/89  bone]
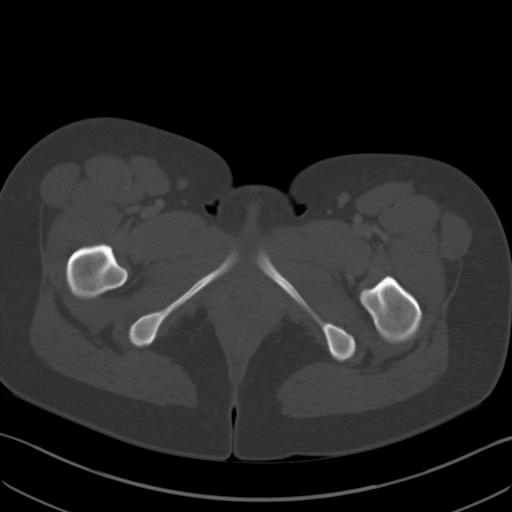
[im 14/89  soft-tissue]
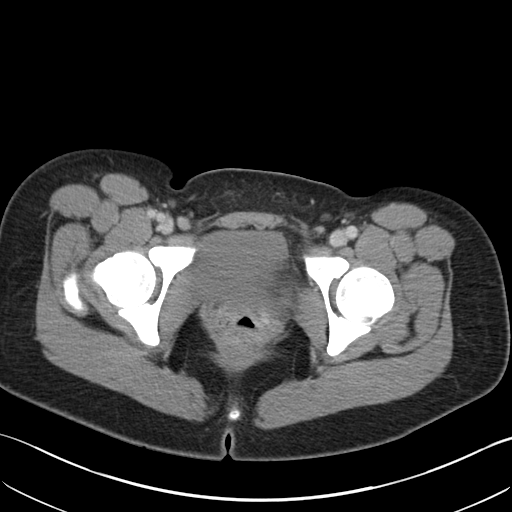
[im 18/89  soft-tissue]
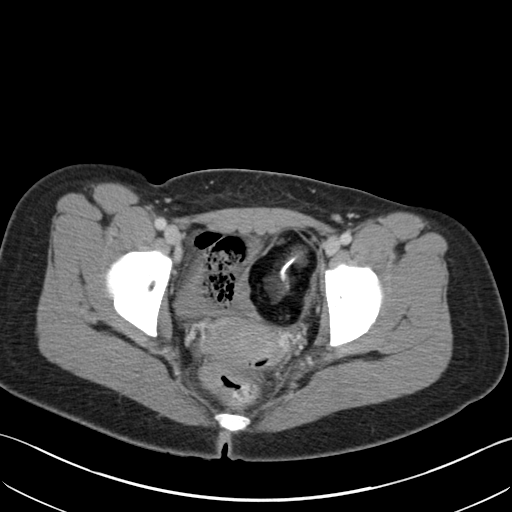
[im 27/89  soft-tissue]
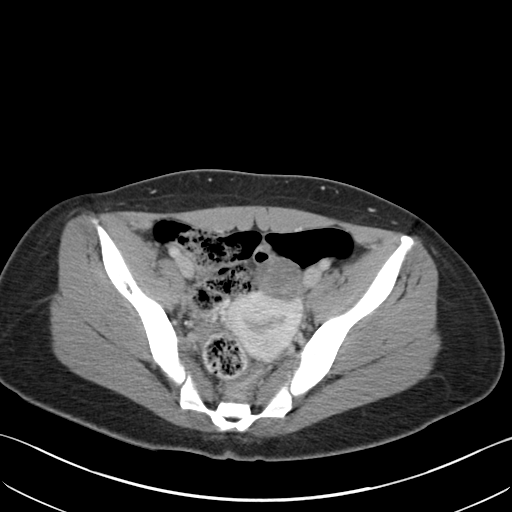
[im 31/89  soft-tissue]
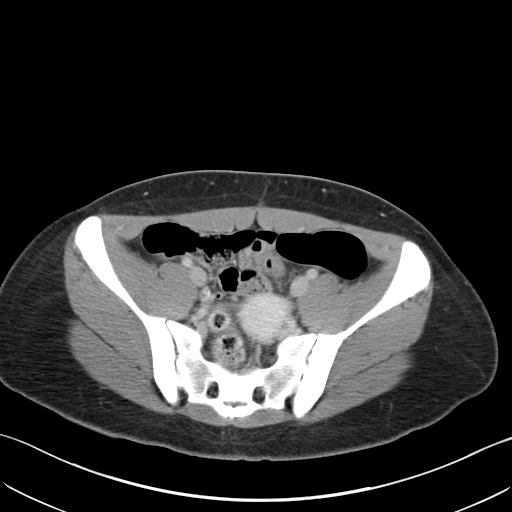
[im 40/89  soft-tissue]
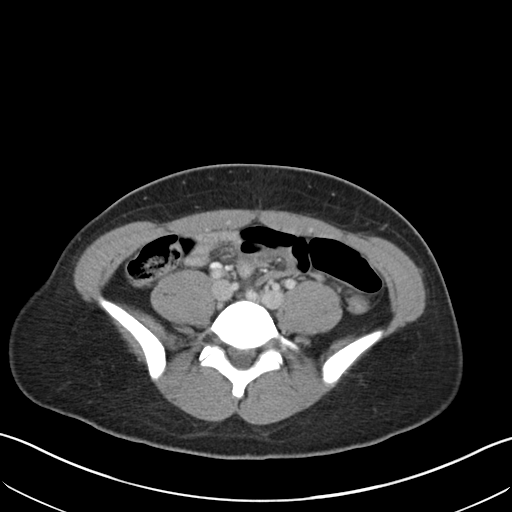
[im 45/89  soft-tissue]
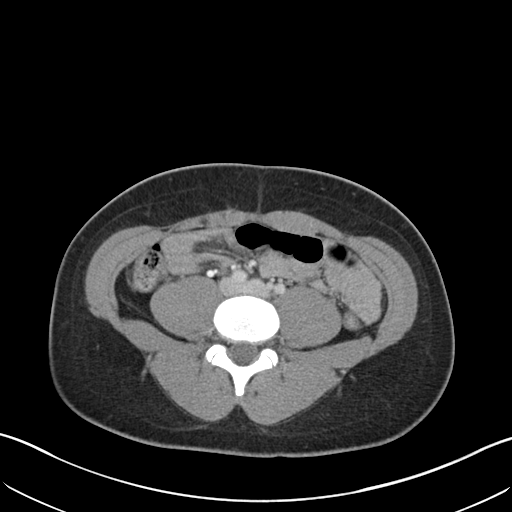
[im 49/89  soft-tissue]
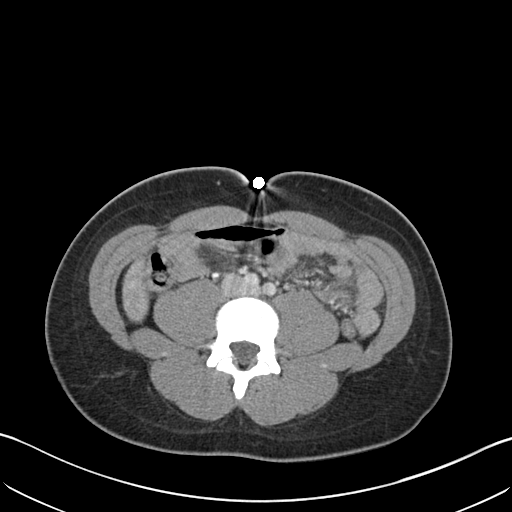
[im 58/89  soft-tissue]
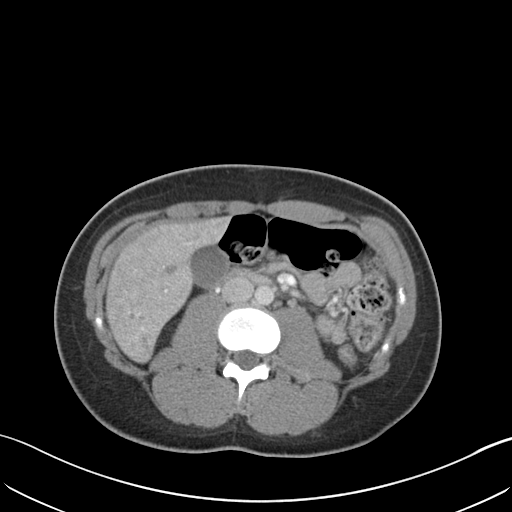
[im 58/89  bone]
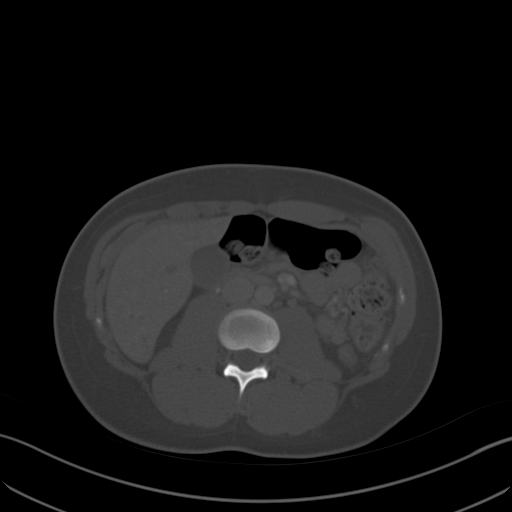
[im 62/89  soft-tissue]
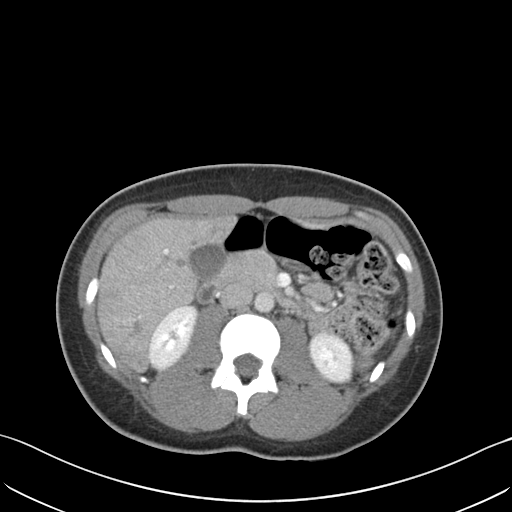
[im 71/89  soft-tissue]
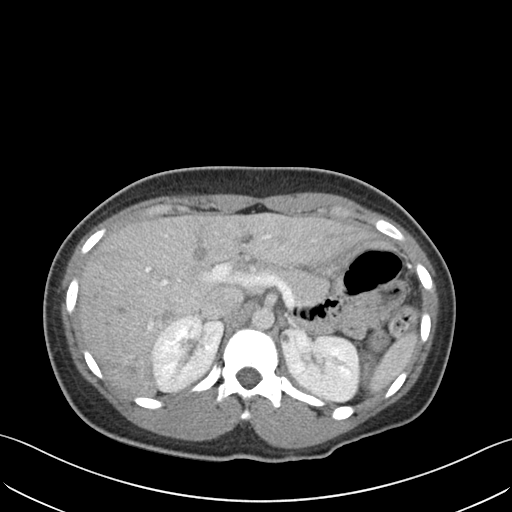
[im 75/89  soft-tissue]
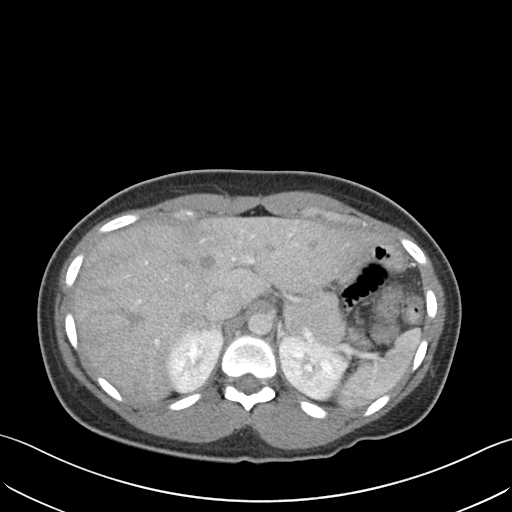
[im 84/89  soft-tissue]
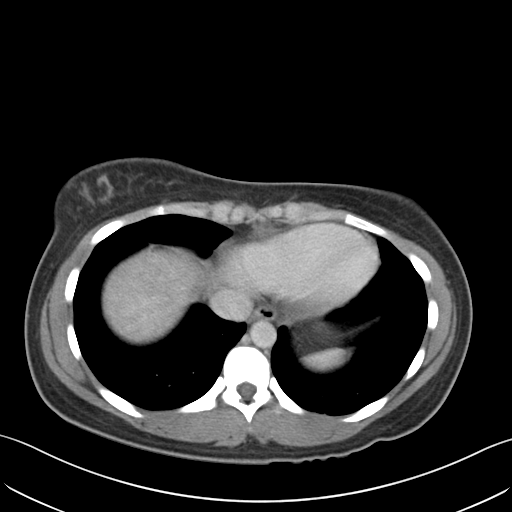

[Series 3: coronal a/|p · coronal · 0.78mm/px · 3 of 93 slices shown]
[im 31/93  soft-tissue]
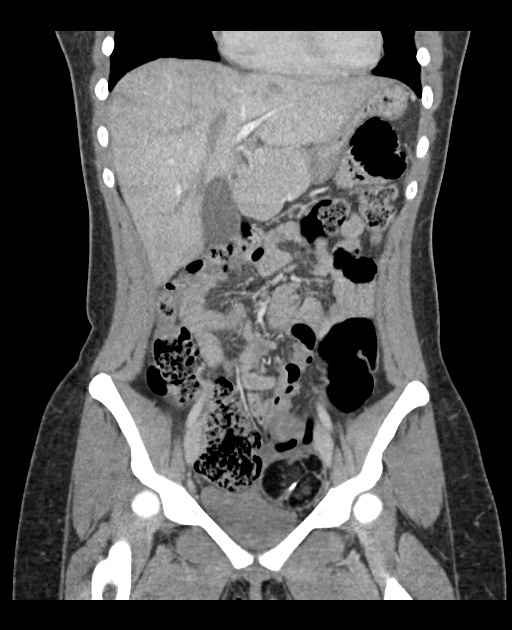
[im 41/93  soft-tissue]
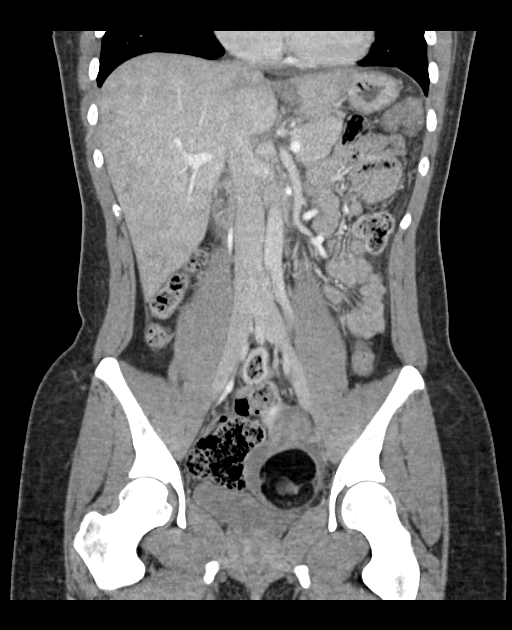
[im 52/93  soft-tissue]
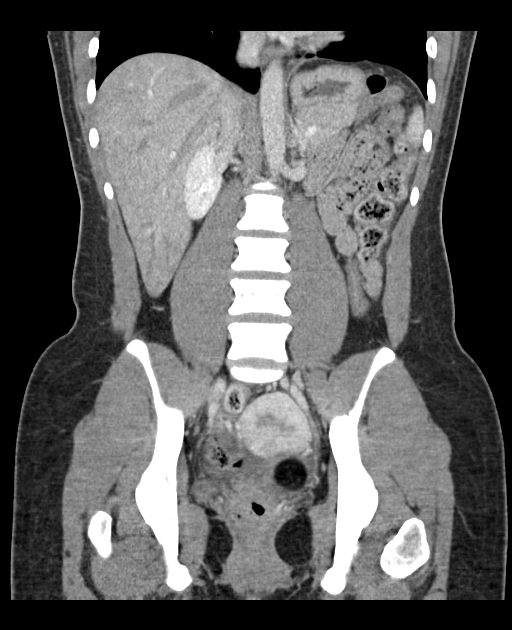

[16 of 46 positions shown; findings below may reference images not displayed]

FINDINGS: Lower chest: Lung bases are clear. No effusions. Heart is normal
size.

Hepatobiliary: No focal hepatic abnormality. Gallbladder
unremarkable.

Pancreas: No focal abnormality or ductal dilatation.

Spleen: No focal abnormality.  Normal size.

Adrenals/Urinary Tract: No adrenal abnormality. No focal renal
abnormality. No stones or hydronephrosis. Urinary bladder is
unremarkable.

Stomach/Bowel: Appendix is normal. Moderate stool burden in the
cecum and ascending colon. Stomach, large and small bowel grossly
unremarkable.

Vascular/Lymphatic: No evidence of aneurysm or adenopathy.

Reproductive: Uterus is unremarkable. Large fatty mass noted in the
left ovary with internal soft tissue and calcifications compatible
with dermoid. This measures 7.7 x 4.9 cm Smaller fat containing
dermoid noted in the right ovary measuring 2.3 x 2.1 cm.

Other: Small amount of free fluid in the pelvis.  No free air.

Musculoskeletal: No acute bony abnormality.
IMPRESSION: Normal appendix.

Bilateral ovarian dermoids, left larger than right.

Small amount of free fluid in the pelvis, likely physiologic.

## 2018-05-02 ENCOUNTER — Encounter (HOSPITAL_COMMUNITY): Payer: Self-pay

## 2018-05-02 ENCOUNTER — Other Ambulatory Visit: Payer: Self-pay

## 2018-05-02 ENCOUNTER — Emergency Department (HOSPITAL_COMMUNITY)
Admission: EM | Admit: 2018-05-02 | Discharge: 2018-05-02 | Disposition: A | Discharge status: Home / Self Care | Payer: BC Managed Care – PPO | Attending: Emergency Medicine | Admitting: Emergency Medicine

## 2018-05-02 DIAGNOSIS — N390 Urinary tract infection, site not specified: Secondary | ICD-10-CM | POA: Insufficient documentation

## 2018-05-02 DIAGNOSIS — Z79899 Other long term (current) drug therapy: Secondary | ICD-10-CM | POA: Insufficient documentation

## 2018-05-02 DIAGNOSIS — R1030 Lower abdominal pain, unspecified: Secondary | ICD-10-CM | POA: Diagnosis present

## 2018-05-02 LAB — I-STAT BETA HCG BLOOD, ED (MC, WL, AP ONLY)

## 2018-05-02 LAB — URINALYSIS, ROUTINE W REFLEX MICROSCOPIC
Bilirubin Urine: NEGATIVE
GLUCOSE, UA: NEGATIVE mg/dL
KETONES UR: 5 mg/dL — AB
Nitrite: NEGATIVE
PROTEIN: 30 mg/dL — AB
Specific Gravity, Urine: 1.012 (ref 1.005–1.030)
WBC, UA: >50 WBC/hpf — ABNORMAL HIGH (ref 0–5)
pH: 5 (ref 5.0–8.0)

## 2018-05-02 LAB — COMPREHENSIVE METABOLIC PANEL
ALK PHOS: 43 U/L (ref 38–126)
ALT: 16 U/L (ref 0–44)
AST: 17 U/L (ref 15–41)
Albumin: 3.7 g/dL (ref 3.5–5.0)
Anion gap: 7 (ref 5–15)
BUN: 13 mg/dL (ref 6–20)
CALCIUM: 9.1 mg/dL (ref 8.9–10.3)
CHLORIDE: 106 mmol/L (ref 98–111)
CO2: 27 mmol/L (ref 22–32)
Creatinine, Ser: 0.92 mg/dL (ref 0.44–1.00)
Glucose, Bld: 96 mg/dL (ref 70–99)
Potassium: 4.2 mmol/L (ref 3.5–5.1)
Sodium: 140 mmol/L (ref 135–145)
Total Bilirubin: 0.5 mg/dL (ref 0.3–1.2)
Total Protein: 8.1 g/dL (ref 6.5–8.1)

## 2018-05-02 LAB — CBC
HCT: 35.6 % — ABNORMAL LOW (ref 36.0–46.0)
Hemoglobin: 11.8 g/dL — ABNORMAL LOW (ref 12.0–15.0)
MCH: 32.5 pg (ref 26.0–34.0)
MCHC: 33.1 g/dL (ref 30.0–36.0)
MCV: 98.1 fL (ref 78.0–100.0)
PLATELETS: 286 10*3/uL (ref 150–400)
RBC: 3.63 MIL/uL — ABNORMAL LOW (ref 3.87–5.11)
RDW: 12.2 % (ref 11.5–15.5)
WBC: 9.8 10*3/uL (ref 4.0–10.5)

## 2018-05-02 LAB — LIPASE, BLOOD: LIPASE: 29 U/L (ref 11–51)

## 2018-05-02 MED ORDER — CEPHALEXIN 500 MG PO CAPS: 500.0000 mg | ORAL_CAPSULE | Freq: Three times a day (TID) | ORAL | 0 refills | Status: DC

## 2018-05-02 NOTE — ED Provider Notes (Signed)
[facility] DEPT Provider Note   CSN: [id number] Arrival date & time: 05/02/18  1224     History   Chief Complaint Chief Complaint  Patient presents with  . Abdominal Pain    HPI Mariah Butler is a 22 y.o. female.  The history is provided by the patient. No language interpreter was used.  Abdominal Pain       22 year old female presenting for evaluation of lower abdominal discomfort.  Patient report for the past week she has had mild lower abdominal pain and lower back pain.  She described pain as an achy sensation, 4 out of 10, intermittent, worsening with prolonged standing.  She also noticed blood in her urine and mild urinary discomfort for the same duration.  She did report one episode of vaginal discharge several days prior but none since.  She is sexually active.  She denies any specific treatment tried.  Does she endorsed mild nausea without vomiting or diarrhea.  No fever or chills, no vaginal bleeding or rash.  She does have history of ovarian cyst.  History reviewed. No pertinent past medical history.  There are no active problems to display for this patient.   Past Surgical History:  Procedure Laterality Date  . CYST REMOVAL TRUNK  08/27/2014  . cyst removed       OB History   None      Home Medications    Prior to Admission medications   Medication Sig Start Date End Date Taking? Authorizing Provider  ibuprofen (ADVIL,MOTRIN) 200 MG tablet Take 200 mg by mouth every 6 (six) hours as needed (pain, cramps).    [provider]  norethindrone-ethinyl estradiol (MICROGESTIN,JUNEL,LOESTRIN) 1-20 MG-MCG tablet Take 1 tablet by mouth daily.    [provider]  traMADol (ULTRAM) 50 MG tablet Take 1 tablet (50 mg total) by mouth every 6 (six) hours as needed. 02/20/17   Kinnie Feil, PA-C    Family History No family history on file.  Social History Social History   Tobacco Use  . Smoking status: Never  Smoker  . Smokeless tobacco: Never Used  Substance Use Topics  . Alcohol use: Yes    Comment: occ  . Drug use: Yes    Types: Marijuana     Allergies   Patient has no known allergies.   Review of Systems Review of Systems  Gastrointestinal: Positive for abdominal pain.  All other systems reviewed and are negative.    Physical Exam Updated Vital Signs BP 125/76 (BP Location: Right Arm)   Pulse 79   Temp 98.6 F (37 C) (Oral)   Resp 14   Ht 5\' 5"  (1.651 m)   Wt 77.1 kg (170 lb)   LMP 04/21/2018   SpO2 100%   BMI 28.29 kg/m   Physical Exam  Constitutional: She appears well-developed and well-nourished. No distress.  HENT:  Head: Atraumatic.  Eyes: Conjunctivae are normal.  Neck: Neck supple.  Cardiovascular: Regular rhythm.  Pulmonary/Chest: Effort normal and breath sounds normal.  Abdominal: Soft. Normal appearance. There is tenderness in the suprapubic area. There is no CVA tenderness.  Genitourinary:  Genitourinary Comments: No CVA tenderness  Neurological: She is alert.  Skin: No rash noted.  Psychiatric: She has a normal mood and affect.  Nursing note and vitals reviewed.    ED Treatments / Results  Labs (all labs ordered are listed, but only abnormal results are displayed) Labs Reviewed  CBC - Abnormal; Notable for the following components:  Result Value   RBC 3.63 (*)    Hemoglobin 11.8 (*)    HCT 35.6 (*)    All other components within normal limits  URINALYSIS, ROUTINE W REFLEX MICROSCOPIC - Abnormal; Notable for the following components:   Hgb urine dipstick MODERATE (*)    Ketones, ur 5 (*)    Protein, ur 30 (*)    Leukocytes, UA SMALL (*)    WBC, UA >50 (*)    Bacteria, UA MANY (*)    Crystals PRESENT (*)    All other components within normal limits  LIPASE, BLOOD  COMPREHENSIVE METABOLIC PANEL  I-STAT BETA HCG BLOOD, ED ([facility], [facility], [facility] ONLY)    EKG None  Radiology No results found.  Procedures Procedures (including critical  care time)  Medications Ordered in ED Medications - No data to display   Initial Impression / Assessment and Plan / ED Course  I have reviewed the triage vital signs and the nursing notes.  Pertinent labs & imaging results that were available during my care of the patient were reviewed by me and considered in my medical decision making (see chart for details).     BP 125/76 (BP Location: Right Arm)   Pulse 79   Temp 98.6 F (37 C) (Oral)   Resp 14   Ht 5\' 5"  (1.651 m)   Wt 77.1 kg (170 lb)   LMP 04/21/2018   SpO2 100%   BMI 28.29 kg/m    Final Clinical Impressions(s) / ED Diagnoses   Final diagnoses:  Acute UTI    ED Discharge Orders        Ordered    cephALEXin (KEFLEX) 500 MG capsule  3 times daily     05/02/18 1700     3:10 PM Patient here with dysuria.  She is well-appearing, minimal tenderness on lower abdominal exam.  Labs are reassuring, await UA.  3:38 PM Urine shows moderate hemoglobin and urine dipsticks, greater than 50 WBC with many bacteria.  Evidence of crystal urine.  At this time, patient will need to be treated for urinary tract infection.  She does not have any CVA tenderness to suggest infected kidney stone and she appears nontoxic therefore have low suspicion for infected kidney stone.  Patient will be treated with Keflex for her urinary tract infection with strict return precaution.   Domenic Moras, PA-C 05/02/18 [place], MD 05/03/18 [phone]

## 2018-05-02 NOTE — ED Triage Notes (Signed)
Pt states lower abd pain. Pt states she noticed some blood in her urine the other day. Pt c/o nausea, and more frequent BMs, denies diarrhea.

## 2018-07-03 ENCOUNTER — Emergency Department (HOSPITAL_COMMUNITY)
Admission: EM | Admit: 2018-07-03 | Discharge: 2018-07-03 | Disposition: A | Discharge status: Home / Self Care | Payer: BC Managed Care – PPO | Attending: Emergency Medicine | Admitting: Emergency Medicine

## 2018-07-03 ENCOUNTER — Encounter (HOSPITAL_COMMUNITY): Payer: Self-pay

## 2018-07-03 ENCOUNTER — Other Ambulatory Visit: Payer: Self-pay

## 2018-07-03 DIAGNOSIS — D279 Benign neoplasm of unspecified ovary: Secondary | ICD-10-CM | POA: Diagnosis not present

## 2018-07-03 DIAGNOSIS — R102 Pelvic and perineal pain: Secondary | ICD-10-CM | POA: Diagnosis present

## 2018-07-03 DIAGNOSIS — B9689 Other specified bacterial agents as the cause of diseases classified elsewhere: Secondary | ICD-10-CM

## 2018-07-03 DIAGNOSIS — Z79899 Other long term (current) drug therapy: Secondary | ICD-10-CM | POA: Diagnosis not present

## 2018-07-03 DIAGNOSIS — N760 Acute vaginitis: Secondary | ICD-10-CM | POA: Diagnosis not present

## 2018-07-03 DIAGNOSIS — D369 Benign neoplasm, unspecified site: Secondary | ICD-10-CM

## 2018-07-03 LAB — URINALYSIS, ROUTINE W REFLEX MICROSCOPIC
BILIRUBIN URINE: NEGATIVE
Glucose, UA: NEGATIVE mg/dL
Hgb urine dipstick: NEGATIVE
Ketones, ur: NEGATIVE mg/dL
Leukocytes, UA: NEGATIVE
NITRITE: NEGATIVE
PH: 7 (ref 5.0–8.0)
Protein, ur: NEGATIVE mg/dL
SPECIFIC GRAVITY, URINE: 1.02 (ref 1.005–1.030)

## 2018-07-03 LAB — WET PREP, GENITAL
SPERM: NONE SEEN
TRICH WET PREP: NONE SEEN
Yeast Wet Prep HPF POC: NONE SEEN

## 2018-07-03 LAB — I-STAT BETA HCG BLOOD, ED (MC, WL, AP ONLY): I-stat hCG, quantitative: <5 m[IU]/mL (ref <5)

## 2018-07-03 MED ORDER — KETOROLAC TROMETHAMINE 30 MG/ML IJ SOLN
30.0000 mg | Freq: Once | INTRAMUSCULAR | Status: AC
  Administered 2018-07-03: 30 mg via INTRAMUSCULAR
  Filled 2018-07-03: qty 1

## 2018-07-03 MED ORDER — METRONIDAZOLE 500 MG PO TABS: 500.0000 mg | ORAL_TABLET | Freq: Two times a day (BID) | ORAL | 0 refills | Status: DC

## 2018-07-03 NOTE — ED Provider Notes (Signed)
[place] EMERGENCY DEPARTMENT Provider Note   CSN: [id number] Arrival date & time: 07/03/18  1203     History   Chief Complaint Chief Complaint  Patient presents with  . Abdominal Pain/cyst on ovaries    HPI Mariah Butler is a 22 y.o. female who presents with pelvic pain and vaginal discharge. PMH significant for bilateral dermoid ovarian cysts. She states that she has intermittent pelvic pain due to her ovarian cysts. It usually improves with Ibuprofen. Yesterday she started having more severe pain in the pelvic area. It's worse on the L side. She denies fever, chills. She had nausea yesterday without vomiting. She also notes a change in her vaginal discharge. It is thinner and creamier. Her urine is dark however she denies dysuria, hematuria, frequency. She was diagnosed with a UTI last month and it does not feel similar. She hasn't been drinking enough fluid and just feels it might be from dehydration. She is sexually active.   HPI  History reviewed. No pertinent past medical history.  There are no active problems to display for this patient.   Past Surgical History:  Procedure Laterality Date  . CYST REMOVAL TRUNK  08/27/2014  . cyst removed       OB History   None      Home Medications    Prior to Admission medications   Medication Sig Start Date End Date Taking? Authorizing Provider  cephALEXin (KEFLEX) 500 MG capsule Take 1 capsule (500 mg total) by mouth 3 (three) times daily. 05/02/18   Domenic Moras, PA-C  Cranberry-Vitamin C (AZO CRANBERRY URINARY TRACT PO) Take 2 tablets by mouth daily.    [provider]  ibuprofen (ADVIL,MOTRIN) 800 MG tablet Take 800 mg by mouth daily as needed for pain. 02/23/17   [provider]  meloxicam (MOBIC) 15 MG tablet Take 15 mg by mouth daily as needed for pain. 11/30/17   [provider]  Norgestimate-Ethinyl Estradiol Triphasic 0.18/0.215/0.25 MG-35 MCG tablet Take 1 tablet by mouth.   02/23/17   [provider]  triamcinolone cream (KENALOG) 0.5 % Apply 1 application topically 2 (two) times daily as needed for itching. 11/30/17   [provider]    Family History History reviewed. No pertinent family history.  Social History Social History   Tobacco Use  . Smoking status: Never Smoker  . Smokeless tobacco: Never Used  Substance Use Topics  . Alcohol use: Yes    Comment: occ  . Drug use: Yes    Types: Marijuana     Allergies   Patient has no known allergies.   Review of Systems Review of Systems  Constitutional: Negative for fever.  Gastrointestinal: Positive for nausea. Negative for abdominal pain and vomiting.  Genitourinary: Positive for pelvic pain (chronic) and vaginal discharge. Negative for decreased urine volume, dysuria, flank pain, hematuria, menstrual problem and vaginal bleeding.  All other systems reviewed and are negative.    Physical Exam Updated Vital Signs BP 110/78   Pulse 60   Temp 98.2 F (36.8 C) (Oral)   Resp 18   Ht 5\' 5"  (1.651 m)   Wt 72.6 kg   LMP 06/16/2018 (Exact Date)   SpO2 100%   BMI 26.63 kg/m   Physical Exam  Constitutional: She is oriented to person, place, and time. She appears well-developed and well-nourished. No distress.  HENT:  Head: Normocephalic and atraumatic.  Eyes: Pupils are equal, round, and reactive to light. Conjunctivae are normal. Right eye exhibits no  discharge. Left eye exhibits no discharge. No scleral icterus.  Neck: Normal range of motion.  Cardiovascular: Normal rate and regular rhythm.  Pulmonary/Chest: Effort normal and breath sounds normal. No respiratory distress.  Abdominal: Soft. Bowel sounds are normal. She exhibits no distension. There is tenderness (mild pelvic tenderness).  Genitourinary:  Genitourinary Comments: Pelvic: No inguinal lymphadenopathy or inguinal hernia noted. Normal external genitalia. No pain with speculum insertion. Closed cervical os with  normal appearance - no rash or lesions. Thick white vaginal discharge in vaginal vault. On bimanual examination no adnexal tenderness or cervical motion tenderness. Chaperone present during exam.    Neurological: She is alert and oriented to person, place, and time.  Skin: Skin is warm and dry.  Psychiatric: She has a normal mood and affect. Her behavior is normal.  Nursing note and vitals reviewed.    ED Treatments / Results  Labs (all labs ordered are listed, but only abnormal results are displayed) Labs Reviewed  WET PREP, GENITAL - Abnormal; Notable for the following components:      Result Value   Clue Cells Wet Prep HPF POC PRESENT (*)    WBC, Wet Prep HPF POC MODERATE (*)    All other components within normal limits  URINALYSIS, ROUTINE W REFLEX MICROSCOPIC  I-STAT BETA HCG BLOOD, ED ([facility], [facility], [facility] ONLY)  GC/CHLAMYDIA PROBE AMP ([place]) NOT AT [facility]    EKG None  Radiology No results found.  Procedures Procedures (including critical care time)  Medications Ordered in ED Medications  ketorolac (TORADOL) 30 MG/ML injection 30 mg (30 mg Intramuscular Given 07/03/18 1348)     Initial Impression / Assessment and Plan / ED Course  I have reviewed the triage vital signs and the nursing notes.  Pertinent labs & imaging results that were available during my care of the patient were reviewed by me and considered in my medical decision making (see chart for details).  22 year old female presents with worsening of her chronic pelvic pain since yesterday. Her vitals are normal. On exam she is calm and comfortable appearing. Her abdomen is soft, non-tender. No CMT or significant adnexal tenderness on exam. UA appears normal. Wet prep has clue cells. Pregnancy test is negative. She was offered repeated US and declined stating she would prefer to f/u with OBGYN. With her minimal tenderness I feel this is reasonable. Will treat for BV and encouraged f/u.  Final Clinical  Impressions(s) / ED Diagnoses   Final diagnoses:  Pelvic pain  Dermoid cyst  Bacterial vaginosis    ED Discharge Orders    None       Recardo Evangelist, PA-C 07/03/18 1453    Isla Pence, MD 07/03/18 1510

## 2018-07-03 NOTE — Discharge Instructions (Signed)
Please make a follow up appointment with OBGYN Take Flagyl twice a day for one week for discharge. Do not drink alcohol with this medicine Return if you are worsening

## 2018-07-03 NOTE — ED Triage Notes (Signed)
Pt presents with 1 year h/o suprapubic pain that worsened yesterday.  Pt denies dysuria but reports urine is darker, reports yellow vaginal discharge noted yesterday.  Pt reports h/o cysts on both ovaries. Nausea since yesterday, reports no appetite.

## 2018-07-05 LAB — GC/CHLAMYDIA PROBE AMP (~~LOC~~) NOT AT ARMC
Chlamydia: NEGATIVE
NEISSERIA GONORRHEA: NEGATIVE

## 2018-08-22 ENCOUNTER — Emergency Department (HOSPITAL_COMMUNITY)
Admission: EM | Admit: 2018-08-22 | Discharge: 2018-08-22 | Disposition: A | Discharge status: Home / Self Care | Payer: BC Managed Care – PPO | Attending: Emergency Medicine | Admitting: Emergency Medicine

## 2018-08-22 ENCOUNTER — Emergency Department (HOSPITAL_COMMUNITY): Payer: BC Managed Care – PPO

## 2018-08-22 ENCOUNTER — Encounter (HOSPITAL_COMMUNITY): Payer: Self-pay

## 2018-08-22 ENCOUNTER — Other Ambulatory Visit: Payer: Self-pay

## 2018-08-22 DIAGNOSIS — R1030 Lower abdominal pain, unspecified: Secondary | ICD-10-CM | POA: Diagnosis present

## 2018-08-22 DIAGNOSIS — N939 Abnormal uterine and vaginal bleeding, unspecified: Secondary | ICD-10-CM

## 2018-08-22 DIAGNOSIS — N83201 Unspecified ovarian cyst, right side: Secondary | ICD-10-CM | POA: Insufficient documentation

## 2018-08-22 DIAGNOSIS — B9689 Other specified bacterial agents as the cause of diseases classified elsewhere: Secondary | ICD-10-CM | POA: Diagnosis not present

## 2018-08-22 DIAGNOSIS — R110 Nausea: Secondary | ICD-10-CM | POA: Insufficient documentation

## 2018-08-22 DIAGNOSIS — N760 Acute vaginitis: Secondary | ICD-10-CM | POA: Diagnosis not present

## 2018-08-22 DIAGNOSIS — N938 Other specified abnormal uterine and vaginal bleeding: Secondary | ICD-10-CM | POA: Insufficient documentation

## 2018-08-22 LAB — COMPREHENSIVE METABOLIC PANEL
ALT: 17 U/L (ref 0–44)
AST: 27 U/L (ref 15–41)
Albumin: 3.8 g/dL (ref 3.5–5.0)
Alkaline Phosphatase: 35 U/L — ABNORMAL LOW (ref 38–126)
Anion gap: 7 (ref 5–15)
BUN: 17 mg/dL (ref 6–20)
CHLORIDE: 106 mmol/L (ref 98–111)
CO2: 25 mmol/L (ref 22–32)
Calcium: 9 mg/dL (ref 8.9–10.3)
Creatinine, Ser: 0.86 mg/dL (ref 0.44–1.00)
GFR calc Af Amer: >60 mL/min (ref >60)
GFR calc non Af Amer: >60 mL/min (ref >60)
Glucose, Bld: 84 mg/dL (ref 70–99)
POTASSIUM: 3.7 mmol/L (ref 3.5–5.1)
Sodium: 138 mmol/L (ref 135–145)
Total Bilirubin: 0.7 mg/dL (ref 0.3–1.2)
Total Protein: 7.7 g/dL (ref 6.5–8.1)

## 2018-08-22 LAB — CBC WITH DIFFERENTIAL/PLATELET
Abs Immature Granulocytes: 0.01 10*3/uL (ref 0.00–0.07)
BASOS ABS: 0.1 10*3/uL (ref 0.0–0.1)
BASOS PCT: 1 %
EOS ABS: 0.1 10*3/uL (ref 0.0–0.5)
Eosinophils Relative: 1 %
HCT: 39 % (ref 36.0–46.0)
Hemoglobin: 12.6 g/dL (ref 12.0–15.0)
IMMATURE GRANULOCYTES: 0 %
Lymphocytes Relative: 28 %
Lymphs Abs: 2.1 10*3/uL (ref 0.7–4.0)
MCH: 32 pg (ref 26.0–34.0)
MCHC: 32.3 g/dL (ref 30.0–36.0)
MCV: 99 fL (ref 80.0–100.0)
MONOS PCT: 7 %
Monocytes Absolute: 0.5 10*3/uL (ref 0.1–1.0)
NRBC: 0 % (ref 0.0–0.2)
Neutro Abs: 4.6 10*3/uL (ref 1.7–7.7)
Neutrophils Relative %: 63 %
PLATELETS: 245 10*3/uL (ref 150–400)
RBC: 3.94 MIL/uL (ref 3.87–5.11)
RDW: 12 % (ref 11.5–15.5)
WBC: 7.4 10*3/uL (ref 4.0–10.5)

## 2018-08-22 LAB — WET PREP, GENITAL
Clue Cells Wet Prep HPF POC: POSITIVE — AB
SPERM: NONE SEEN
TRICH WET PREP: NONE SEEN
YEAST WET PREP: NONE SEEN

## 2018-08-22 LAB — URINALYSIS, ROUTINE W REFLEX MICROSCOPIC
Bacteria, UA: NONE SEEN
Bilirubin Urine: NEGATIVE
GLUCOSE, UA: NEGATIVE mg/dL
KETONES UR: NEGATIVE mg/dL
Leukocytes, UA: NEGATIVE
Nitrite: NEGATIVE
PH: 7 (ref 5.0–8.0)
PROTEIN: NEGATIVE mg/dL
Specific Gravity, Urine: 1.023 (ref 1.005–1.030)

## 2018-08-22 LAB — I-STAT BETA HCG BLOOD, ED (MC, WL, AP ONLY): I-stat hCG, quantitative: <5 m[IU]/mL (ref <5)

## 2018-08-22 MED ORDER — ONDANSETRON HCL 4 MG/2ML IJ SOLN
4.0000 mg | Freq: Once | INTRAMUSCULAR | Status: AC
  Administered 2018-08-22: 4 mg via INTRAVENOUS
  Filled 2018-08-22: qty 2

## 2018-08-22 MED ORDER — ONDANSETRON 4 MG PO TBDP: ORAL_TABLET | ORAL | 0 refills | Status: DC

## 2018-08-22 MED ORDER — METRONIDAZOLE 500 MG PO TABS: 500.0000 mg | ORAL_TABLET | Freq: Two times a day (BID) | ORAL | 0 refills | Status: DC

## 2018-08-22 MED ORDER — KETOROLAC TROMETHAMINE 30 MG/ML IJ SOLN
30.0000 mg | Freq: Once | INTRAMUSCULAR | Status: AC
  Administered 2018-08-22: 30 mg via INTRAVENOUS
  Filled 2018-08-22: qty 1

## 2018-08-22 MED ORDER — ACETAMINOPHEN 500 MG PO TABS
1000.0000 mg | ORAL_TABLET | Freq: Once | ORAL | Status: AC
  Administered 2018-08-22: 1000 mg via ORAL
  Filled 2018-08-22: qty 2

## 2018-08-22 MED ORDER — IBUPROFEN 800 MG PO TABS: 800.0000 mg | ORAL_TABLET | Freq: Three times a day (TID) | ORAL | 0 refills | Status: DC | PRN

## 2018-08-22 NOTE — ED Triage Notes (Signed)
Patient arrived via POV. Patient is AOx4 and ambulatory. Patient chief complaint is abdominal pain that began and ended on Friday. Patient stated she was fine and in no pain Saturday or today however Patient is also complaining of some spotting and patient stated she had heard menstrual cycle 2 weeks ago and states also that she feels nauseas. Patient is on birth control pill since 2013 per patient.

## 2018-08-22 NOTE — ED Provider Notes (Signed)
[facility] DEPT Provider Note   CSN: [id number] Arrival date & time: 08/22/18  1356     History   Chief Complaint Chief Complaint  Patient presents with  . Multiple complaints    HPI Mariah Butler is a 22 y.o. female.  Mariah Butler is a 22 y.o. Female with a history of ovarian cysts who presents to the emergency department for evaluation of lower abdominal and pelvic pain, nausea and vaginal bleeding.  Patient reports symptoms started on Friday when she started to have lower abdominal pain that is since improved, but she is continued to have nausea without vomiting.  Patient also reports that she started having spotting and heavier vaginal bleeding today.  Patient reports her last menstrual cycle was 2 weeks ago and her cycle is usually fairly regular.  She denies any vaginal discharge.  She is sexually active but has not had any new partners recently.  Does report history of ovarian cyst and this pain feels similar.  She has not had any fevers or chills, no dysuria or urinary frequency.  No diarrhea, melena or hematochezia.  No chest pain or shortness of breath, no cough.  She has not taken anything to treat her symptoms prior to arrival.     History reviewed. No pertinent past medical history.  There are no active problems to display for this patient.   Past Surgical History:  Procedure Laterality Date  . CYST REMOVAL TRUNK  08/27/2014  . cyst removed       OB History   None      Home Medications    Prior to Admission medications   Medication Sig Start Date End Date Taking? Authorizing Provider  Norgestimate-Ethinyl Estradiol Triphasic 0.18/0.215/0.25 MG-35 MCG tablet Take 1 tablet by mouth.  02/23/17  Yes [provider]  cephALEXin (KEFLEX) 500 MG capsule Take 1 capsule (500 mg total) by mouth 3 (three) times daily. Patient not taking: Reported on 08/22/2018 05/02/18   Domenic Moras, PA-C  ibuprofen (ADVIL,MOTRIN) 800 MG  tablet Take 1 tablet (800 mg total) by mouth every 8 (eight) hours as needed. 08/22/18   Jacqlyn Larsen, PA-C  metroNIDAZOLE (FLAGYL) 500 MG tablet Take 1 tablet (500 mg total) by mouth 2 (two) times daily. One po bid x 7 days 08/22/18   Jacqlyn Larsen, PA-C  ondansetron (ZOFRAN ODT) 4 MG disintegrating tablet 4mg  ODT q4 hours prn nausea/vomit 08/22/18   Jacqlyn Larsen, PA-C    Family History No family history on file.  Social History Social History   Tobacco Use  . Smoking status: Never Smoker  . Smokeless tobacco: Never Used  Substance Use Topics  . Alcohol use: Yes    Comment: occ  . Drug use: Yes    Types: Marijuana    Comment: Here and there     Allergies   Patient has no known allergies.   Review of Systems Review of Systems  Constitutional: Negative for chills and fever.  HENT: Negative.   Eyes: Negative for visual disturbance.  Respiratory: Negative for cough and shortness of breath.   Cardiovascular: Negative for chest pain.  Gastrointestinal: Positive for abdominal pain, nausea and vomiting. Negative for constipation and diarrhea.  Genitourinary: Positive for pelvic pain and vaginal bleeding. Negative for dysuria, frequency and vaginal discharge.  Musculoskeletal: Negative for arthralgias and myalgias.  Skin: Negative for color change and rash.  Neurological: Negative for dizziness, syncope and light-headedness.     Physical Exam Updated Vital Signs BP  115/75 (BP Location: Left Arm)   Pulse 74   Temp 98.6 F (37 C) (Oral)   Resp 16   Ht 5\' 5"  (1.651 m)   Wt 70.5 kg   LMP 08/03/2018 (Approximate)   SpO2 99%   BMI 25.86 kg/m   Physical Exam  Constitutional: She appears well-developed and well-nourished. No distress.  HENT:  Head: Normocephalic and atraumatic.  Mouth/Throat: Oropharynx is clear and moist.  Eyes: Right eye exhibits no discharge. Left eye exhibits no discharge.  Neck: Neck supple.  Cardiovascular: Normal rate, regular rhythm, normal  heart sounds and intact distal pulses. Exam reveals no gallop and no friction rub.  No murmur heard. Pulmonary/Chest: Effort normal and breath sounds normal. No respiratory distress.  Respirations equal and unlabored, patient able to speak in full sentences, lungs clear to auscultation bilaterally  Abdominal: Soft. Bowel sounds are normal. She exhibits no distension and no mass. There is tenderness. There is no guarding.  Abdomen is soft, nondistended, bowel present throughout, there is mild tenderness across the lower abdomen without focal guarding, no CVA tenderness bilaterally.  Genitourinary:  Genitourinary Comments: Chaperone present during pelvic exam. No external genital lesions noted. Moderate amount of blood in vaginal vault, no brisk bleeding from the cervical loss, no vaginal discharge noted, no cervical motion tenderness there is adnexal tenderness on the right without palpable mass.  No left adnexal tenderness.  Musculoskeletal: She exhibits no deformity.  Neurological: She is alert. Coordination normal.  Skin: Skin is warm and dry. Capillary refill takes less than 2 seconds. She is not diaphoretic.  Psychiatric: She has a normal mood and affect. Her behavior is normal.  Nursing note and vitals reviewed.    ED Treatments / Results  Labs (all labs ordered are listed, but only abnormal results are displayed) Labs Reviewed  WET PREP, GENITAL - Abnormal; Notable for the following components:      Result Value   Clue Cells Wet Prep HPF POC POSITIVE (*)    WBC, Wet Prep HPF POC FEW (*)    All other components within normal limits  URINALYSIS, ROUTINE W REFLEX MICROSCOPIC - Abnormal; Notable for the following components:   Hgb urine dipstick LARGE (*)    All other components within normal limits  COMPREHENSIVE METABOLIC PANEL - Abnormal; Notable for the following components:   Alkaline Phosphatase 35 (*)    All other components within normal limits  CBC WITH  DIFFERENTIAL/PLATELET  RPR  HIV ANTIBODY (ROUTINE TESTING W REFLEX)  I-STAT BETA HCG BLOOD, ED ([facility], [facility], [facility] ONLY)  GC/CHLAMYDIA PROBE AMP ([place]) NOT AT [facility]    EKG None  Radiology US Transvaginal Non-ob  Result Date: 08/22/2018 CLINICAL DATA:  Right adnexal pain. EXAM: TRANSABDOMINAL AND TRANSVAGINAL ULTRASOUND OF PELVIS DOPPLER ULTRASOUND OF OVARIES TECHNIQUE: Both transabdominal and transvaginal ultrasound examinations of the pelvis were performed. Transabdominal technique was performed for global imaging of the pelvis including uterus, ovaries, adnexal regions, and pelvic cul-de-sac. It was necessary to proceed with endovaginal exam following the transabdominal exam to visualize the uterus and ovaries. Color and duplex Doppler ultrasound was utilized to evaluate blood flow to the ovaries. COMPARISON:  pelvic ultrasound 02/20/2017 CT abdomen pelvis 02/20/2017 FINDINGS: Uterus Measurements: 8.8 x 4.4 x 5.4 cm. No fibroids or other mass visualized. Endometrium Thickness: 7 mm.  No focal abnormality visualized. Right ovary Measurements: 4.3 x 2.4 x 2.3 cm. There is a 2.9 x 2.3 x 2.6 cm hyperechoic structure with associated shadowing. This is unchanged. Left ovary  Not visualized due to overlying bowel. Pulsed Doppler evaluation shows normal low resistance arterial and venous waveforms of the right ovary. Other findings Small amount of fluid in the posterior cul-de-sac. IMPRESSION: 1. 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 Normal blood flow to the right ovary. 2. Nonvisualization of left ovary due to overlying bowel gas. Electronically Signed   By: Ulyses Jarred M.D.   On: 08/22/2018 19:05   US Pelvis Complete  Result Date: 08/22/2018 CLINICAL DATA:  Right adnexal pain. EXAM: TRANSABDOMINAL AND TRANSVAGINAL ULTRASOUND OF PELVIS DOPPLER ULTRASOUND OF OVARIES TECHNIQUE: Both transabdominal and transvaginal ultrasound examinations of the pelvis were  performed. Transabdominal technique was performed for global imaging of the pelvis including uterus, ovaries, adnexal regions, and pelvic cul-de-sac. It was necessary to proceed with endovaginal exam following the transabdominal exam to visualize the uterus and ovaries. Color and duplex Doppler ultrasound was utilized to evaluate blood flow to the ovaries. COMPARISON:  pelvic ultrasound 02/20/2017 CT abdomen pelvis 02/20/2017 FINDINGS: Uterus Measurements: 8.8 x 4.4 x 5.4 cm. No fibroids or other mass visualized. Endometrium Thickness: 7 mm.  No focal abnormality visualized. Right ovary Measurements: 4.3 x 2.4 x 2.3 cm. There is a 2.9 x 2.3 x 2.6 cm hyperechoic structure with associated shadowing. This is unchanged. Left ovary Not visualized due to overlying bowel. Pulsed Doppler evaluation shows normal low resistance arterial and venous waveforms of the right ovary. Other findings Small amount of fluid in the posterior cul-de-sac. IMPRESSION: 1. 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 Normal blood flow to the right ovary. 2. Nonvisualization of left ovary due to overlying bowel gas. Electronically Signed   By: Ulyses Jarred M.D.   On: 08/22/2018 19:05   [place] Art/ven Flow Abd Pelv Doppler  Result Date: 08/22/2018 CLINICAL DATA:  Right adnexal pain. EXAM: TRANSABDOMINAL AND TRANSVAGINAL ULTRASOUND OF PELVIS DOPPLER ULTRASOUND OF OVARIES TECHNIQUE: Both transabdominal and transvaginal ultrasound examinations of the pelvis were performed. Transabdominal technique was performed for global imaging of the pelvis including uterus, ovaries, adnexal regions, and pelvic cul-de-sac. It was necessary to proceed with endovaginal exam following the transabdominal exam to visualize the uterus and ovaries. Color and duplex Doppler ultrasound was utilized to evaluate blood flow to the ovaries. COMPARISON:  pelvic ultrasound 02/20/2017 CT abdomen pelvis 02/20/2017 FINDINGS: Uterus  Measurements: 8.8 x 4.4 x 5.4 cm. No fibroids or other mass visualized. Endometrium Thickness: 7 mm.  No focal abnormality visualized. Right ovary Measurements: 4.3 x 2.4 x 2.3 cm. There is a 2.9 x 2.3 x 2.6 cm hyperechoic structure with associated shadowing. This is unchanged. Left ovary Not visualized due to overlying bowel. Pulsed Doppler evaluation shows normal low resistance arterial and venous waveforms of the right ovary. Other findings Small amount of fluid in the posterior cul-de-sac. IMPRESSION: 1. 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 Normal blood flow to the right ovary. 2. Nonvisualization of left ovary due to overlying bowel gas. Electronically Signed   By: Ulyses Jarred M.D.   On: 08/22/2018 19:05    Procedures Procedures (including critical care time)  Medications Ordered in ED Medications  ketorolac (TORADOL) 30 MG/ML injection 30 mg (30 mg Intravenous Given 08/22/18 1746)  ondansetron (ZOFRAN) injection 4 mg (4 mg Intravenous Given 08/22/18 1747)  acetaminophen (TYLENOL) tablet 1,000 mg (1,000 mg Oral Given 08/22/18 2025)     Initial Impression / Assessment and Plan / ED Course  I have reviewed the triage vital signs and  the nursing notes.  Pertinent labs & imaging results that were available during my care of the patient were reviewed by me and considered in my medical decision making (see chart for details).  Patient presents with lower abdominal pelvic pain, nausea and vaginal bleeding.  Last menstrual cycle was 2 weeks ago and her cycle is usually more regular.  She does have history of ovarian cysts or but this pain is similar.  Denies discharge, no fevers or chills.  Patient is overall well-appearing and in no acute distress on arrival.  She does have some mild lower abdominal tenderness without guarding, pelvic exam reveals moderate amount of blood with no brisk bleeding from the cervical loss.  There is no obvious discharge exam  is not concerning for PID there is some right adnexal tenderness where patient reports her prior ovarian cyst.  Interestingly no nausea will repeat pelvic ultrasound and get abdominal labs, STD testing collected as well.  Toradol and Zofran for pain.  Patient is overall reassuring there is no leukocytosis, stable hemoglobin, no acute electrolyte derangements, normal renal and liver function and normal lipase.  Urinalysis without signs of infection.  Wet prep shows clue cells and a few WBCs most consistent with BV, no other significant findings.  STD testing collected.  Pelvic ultrasound shows right dermoid cyst unchanged from prior no evidence of ovarian torsion on the left ovary is not visualized due to gas bubble the patient does not have any left adnexal tenderness.  Will have patient follow-up with OB/GYN recommend NSAIDs to help with abnormal bleeding and pain from cyst, Zofran for nausea and will treat BV with Flagyl, have advised patient not use alcohol if taking this.  Return precautions been discussed and patient expresses understanding.  I feel she is stable for discharge home at this time.  Final Clinical Impressions(s) / ED Diagnoses   Final diagnoses:  Lower abdominal pain  Nausea  Vaginal bleeding  Cyst of right ovary  BV (bacterial vaginosis)    ED Discharge Orders         Ordered    metroNIDAZOLE (FLAGYL) 500 MG tablet  2 times daily     08/22/18 2148    ondansetron (ZOFRAN ODT) 4 MG disintegrating tablet     08/22/18 2148    ibuprofen (ADVIL,MOTRIN) 800 MG tablet  Every 8 hours PRN     08/22/18 2148           Jacqlyn Larsen, PA-C 08/23/18 1611    Charlesetta Shanks, MD 08/23/18 1958

## 2018-08-22 NOTE — Discharge Instructions (Signed)
Your work-up today looks good overall.  Your ultrasound shows a right-sided ovarian cyst and your wet prep shows bacterial vaginosis.  You can use ibuprofen 800 mg 3 times daily to help with vaginal bleeding and pain from the cyst.  Zofran as needed for nausea and Flagyl to treat bacterial vaginosis.  Make sure you do not drink any alcohol while taking Flagyl as it will cause severe nausea and vomiting.  I would like free to follow-up with OB/GYN for repeat ultrasound of your cyst or if vaginal bleeding outside of your cycle persists.  Return to the emergency department for fevers, worsening abdominal pain, persistent vomiting, heavy vaginal bleeding or any other new concerning symptoms.

## 2018-08-24 ENCOUNTER — Other Ambulatory Visit: Payer: Self-pay | Admitting: Obstetrics and Gynecology

## 2018-08-24 LAB — GC/CHLAMYDIA PROBE AMP (~~LOC~~) NOT AT ARMC
Chlamydia: POSITIVE — AB
NEISSERIA GONORRHEA: NEGATIVE

## 2018-08-24 LAB — RPR: RPR: NONREACTIVE

## 2018-08-24 LAB — HIV ANTIBODY (ROUTINE TESTING W REFLEX): HIV Screen 4th Generation wRfx: NONREACTIVE

## 2018-08-24 MED ORDER — AZITHROMYCIN 250 MG PO TABS: 1000.0000 mg | ORAL_TABLET | Freq: Once | ORAL | 0 refills | Status: AC

## 2018-08-24 NOTE — Progress Notes (Signed)
+   chlamydia  Rx sent for azithromycin    Oris Staffieri, Artist Pais, NP 08/24/2018 4:10 PM

## 2018-08-27 NOTE — ED Notes (Signed)
08/27/2018, Pt. Called and had questions about STD.  All questions answered.

## 2019-05-16 ENCOUNTER — Other Ambulatory Visit (HOSPITAL_COMMUNITY)
Admission: RE | Admit: 2019-05-16 | Discharge: 2019-05-16 | Disposition: A | Discharge status: Home / Self Care | Payer: BC Managed Care – PPO | Source: Ambulatory Visit | Attending: Obstetrics and Gynecology | Admitting: Obstetrics and Gynecology

## 2019-05-16 ENCOUNTER — Encounter (HOSPITAL_COMMUNITY)
Admission: RE | Admit: 2019-05-16 | Discharge: 2019-05-16 | Disposition: A | Discharge status: Home / Self Care | Payer: BC Managed Care – PPO | Source: Ambulatory Visit | Attending: Obstetrics and Gynecology | Admitting: Obstetrics and Gynecology

## 2019-05-16 ENCOUNTER — Other Ambulatory Visit: Payer: Self-pay

## 2019-05-16 DIAGNOSIS — N83209 Unspecified ovarian cyst, unspecified side: Secondary | ICD-10-CM | POA: Diagnosis not present

## 2019-05-16 DIAGNOSIS — Z01812 Encounter for preprocedural laboratory examination: Secondary | ICD-10-CM | POA: Insufficient documentation

## 2019-05-16 DIAGNOSIS — Z20828 Contact with and (suspected) exposure to other viral communicable diseases: Secondary | ICD-10-CM | POA: Insufficient documentation

## 2019-05-16 LAB — CBC
HCT: 37.3 % (ref 36.0–46.0)
Hemoglobin: 12.3 g/dL (ref 12.0–15.0)
MCH: 33.3 pg (ref 26.0–34.0)
MCHC: 33 g/dL (ref 30.0–36.0)
MCV: 101.1 fL — ABNORMAL HIGH (ref 80.0–100.0)
Platelets: 225 10*3/uL (ref 150–400)
RBC: 3.69 MIL/uL — ABNORMAL LOW (ref 3.87–5.11)
RDW: 11.7 % (ref 11.5–15.5)
WBC: 6 10*3/uL (ref 4.0–10.5)
nRBC: 0 % (ref 0.0–0.2)

## 2019-05-16 LAB — ABO/RH: ABO/RH(D): B POS

## 2019-05-16 LAB — SARS CORONAVIRUS 2 (TAT 6-24 HRS): SARS Coronavirus 2: NEGATIVE

## 2019-05-18 ENCOUNTER — Other Ambulatory Visit: Payer: Self-pay

## 2019-05-18 ENCOUNTER — Encounter (HOSPITAL_BASED_OUTPATIENT_CLINIC_OR_DEPARTMENT_OTHER): Payer: Self-pay | Admitting: *Deleted

## 2019-05-18 NOTE — H&P (Signed)
Mariah Butler is an 23 y.o. female G0 who presents for scheduled mini-lap with bilateral ovarian cystectomies for bilateral adnexal masses.  Pt first discovered bilateral cysts on her ovaries in 2018 when had severe pain and had imaging. Will have intermittent pain that bad every few months. She has normal menses on OCP's since 6 years and pain is worse with that. Same partner of 2 years No nausea or vomiting.   [place] 02/02/19 revealed a right ovary with 3cm probable dermoid, left ovary not visualized but in adnexa has a 6.5cm mass and additional 4cm mass/possible endometrioma.  Her surgery was delayed due to COVID  Pertinent Gynecological History: Menses: regular on OCP's  Contraception: OCP (estrogen/progesterone)  OB History: None   Menstrual History:  Patient's last menstrual period was 05/02/2019 (approximate).    Past Medical History:  Diagnosis Date  . Adnexal mass   . History of concussion    per pt approx. 2014, no loc,  no residual  . History of excision of pilonidal cyst 2017  . Pelvic pain   . Wears glasses     Past Surgical History:  Procedure Laterality Date  . PILONIDAL CYST EXCISION  2017 approx.    History reviewed. No pertinent family history.  Social History:  reports that she has never smoked. She has never used smokeless tobacco. She reports previous alcohol use. She reports current drug use. Drug: Marijuana.  Allergies: No Known Allergies  No medications prior to admission.    Review of Systems  Constitutional: Negative for fever.  Gastrointestinal: Positive for abdominal pain (intermittent ).    Height 5\' 5"  (1.651 m), weight 67.6 kg, last menstrual period 05/02/2019. Physical Exam  Constitutional: She appears well-developed.  Eyes: Pupils are equal, round, and reactive to light.  Cardiovascular: Normal rate and regular rhythm.  Respiratory: Effort normal.  GI: Soft.  Genitourinary:    Vagina and uterus normal.     Genitourinary Comments:  Adnexal fullness on right   Neurological: She is alert.  Psychiatric: She has a normal mood and affect.    No results found for this or any previous visit (from the past 24 hour(s)).  No results found.  Assessment/Plan: Pt counseled extensively about the risks and benefits of minilaparotomy and bilateral cystectomies. We discussed specifically bleeding, infection, possible damage to bowel, bladder and ureters. Described the procedure in detail and the removal of the cysts with the primary goal to save as much ovarian tissue as possible, but it is possible that we would need to remove an ovary if no normal tissue seen. The pt is ready to proceed  Logan Bores 05/18/2019, 11:49 AM

## 2019-05-18 NOTE — Progress Notes (Signed)
Spoke w/ pt via phone for pre-op interview.  Npo after mn.  Arrive at 0530.  Needs urine preg.  Has cbc and T&S results dated 05-16-2019 in chart and epic.  Pt had covid test done 05-16-2019.  Reviewed [facility] guidelines and visitor restriction policy.

## 2019-05-19 ENCOUNTER — Other Ambulatory Visit: Payer: Self-pay

## 2019-05-19 ENCOUNTER — Ambulatory Visit (HOSPITAL_BASED_OUTPATIENT_CLINIC_OR_DEPARTMENT_OTHER): Payer: BC Managed Care – PPO | Admitting: Anesthesiology

## 2019-05-19 ENCOUNTER — Encounter (HOSPITAL_BASED_OUTPATIENT_CLINIC_OR_DEPARTMENT_OTHER): Payer: Self-pay | Admitting: Emergency Medicine

## 2019-05-19 ENCOUNTER — Ambulatory Visit (HOSPITAL_BASED_OUTPATIENT_CLINIC_OR_DEPARTMENT_OTHER)
Admission: RE | Admit: 2019-05-19 | Discharge: 2019-05-20 | Disposition: A | Discharge status: Home / Self Care | Payer: BC Managed Care – PPO | Source: Ambulatory Visit | Attending: Obstetrics and Gynecology | Admitting: Obstetrics and Gynecology

## 2019-05-19 ENCOUNTER — Encounter (HOSPITAL_BASED_OUTPATIENT_CLINIC_OR_DEPARTMENT_OTHER)
Admission: RE | Disposition: A | Discharge status: Home / Self Care | Payer: Self-pay | Source: Ambulatory Visit | Attending: Obstetrics and Gynecology

## 2019-05-19 DIAGNOSIS — Z9889 Other specified postprocedural states: Secondary | ICD-10-CM

## 2019-05-19 DIAGNOSIS — D271 Benign neoplasm of left ovary: Secondary | ICD-10-CM | POA: Insufficient documentation

## 2019-05-19 DIAGNOSIS — Z793 Long term (current) use of hormonal contraceptives: Secondary | ICD-10-CM | POA: Diagnosis not present

## 2019-05-19 DIAGNOSIS — D270 Benign neoplasm of right ovary: Secondary | ICD-10-CM | POA: Diagnosis not present

## 2019-05-19 DIAGNOSIS — R1900 Intra-abdominal and pelvic swelling, mass and lump, unspecified site: Secondary | ICD-10-CM | POA: Diagnosis present

## 2019-05-19 HISTORY — PX: LAPAROTOMY: SHX154

## 2019-05-19 HISTORY — DX: Other specified conditions associated with female genital organs and menstrual cycle: N94.89

## 2019-05-19 HISTORY — PX: OVARIAN CYST REMOVAL: SHX89

## 2019-05-19 HISTORY — DX: Presence of spectacles and contact lenses: Z97.3

## 2019-05-19 HISTORY — PX: LAPAROSCOPIC OVARIAN CYSTECTOMY: SHX6248

## 2019-05-19 HISTORY — DX: Personal history of traumatic brain injury: Z87.820

## 2019-05-19 HISTORY — DX: Pelvic and perineal pain: R10.2

## 2019-05-19 LAB — TYPE AND SCREEN
ABO/RH(D): B POS
Antibody Screen: NEGATIVE

## 2019-05-19 LAB — POCT PREGNANCY, URINE: Preg Test, Ur: NEGATIVE

## 2019-05-19 SURGERY — LAPAROTOMY
Anesthesia: General | Site: Abdomen | Laterality: Right

## 2019-05-19 MED ORDER — SCOPOLAMINE 1 MG/3DAYS TD PT72
MEDICATED_PATCH | TRANSDERMAL | Status: DC | PRN
  Administered 2019-05-19: 1 via TRANSDERMAL

## 2019-05-19 MED ORDER — HYDROMORPHONE HCL 1 MG/ML IJ SOLN
INTRAMUSCULAR | Status: AC
  Filled 2019-05-19: qty 1

## 2019-05-19 MED ORDER — ACETAMINOPHEN 500 MG PO TABS
1000.0000 mg | ORAL_TABLET | Freq: Four times a day (QID) | ORAL | Status: DC
  Administered 2019-05-19 – 2019-05-20 (×3): 1000 mg via ORAL
  Filled 2019-05-19: qty 2

## 2019-05-19 MED ORDER — ONDANSETRON HCL 4 MG/2ML IJ SOLN
INTRAMUSCULAR | Status: AC
  Filled 2019-05-19: qty 2

## 2019-05-19 MED ORDER — ONDANSETRON HCL 4 MG/2ML IJ SOLN
INTRAMUSCULAR | Status: DC | PRN
  Administered 2019-05-19: 4 mg via INTRAVENOUS

## 2019-05-19 MED ORDER — OXYCODONE HCL 5 MG PO TABS
ORAL_TABLET | ORAL | Status: AC
  Filled 2019-05-19: qty 1

## 2019-05-19 MED ORDER — ACETAMINOPHEN 500 MG PO TABS
ORAL_TABLET | ORAL | Status: AC
  Filled 2019-05-19: qty 2

## 2019-05-19 MED ORDER — NORGESTIMATE-ETH ESTRADIOL 0.25-35 MG-MCG PO TABS
1.0000 | ORAL_TABLET | Freq: Every day | ORAL | Status: DC
  Administered 2019-05-19: 1 via ORAL

## 2019-05-19 MED ORDER — CEFAZOLIN SODIUM-DEXTROSE 2-4 GM/100ML-% IV SOLN
INTRAVENOUS | Status: AC
  Filled 2019-05-19: qty 100

## 2019-05-19 MED ORDER — FENTANYL CITRATE (PF) 100 MCG/2ML IJ SOLN
INTRAMUSCULAR | Status: DC | PRN
  Administered 2019-05-19: 150 ug via INTRAVENOUS
  Administered 2019-05-19 (×2): 50 ug via INTRAVENOUS

## 2019-05-19 MED ORDER — PROPOFOL 10 MG/ML IV BOLUS
INTRAVENOUS | Status: DC | PRN
  Administered 2019-05-19: 150 mg via INTRAVENOUS

## 2019-05-19 MED ORDER — LIDOCAINE 2% (20 MG/ML) 5 ML SYRINGE
INTRAMUSCULAR | Status: DC | PRN
  Administered 2019-05-19: 100 mg via INTRAVENOUS

## 2019-05-19 MED ORDER — ONDANSETRON HCL 4 MG PO TABS
4.0000 mg | ORAL_TABLET | Freq: Four times a day (QID) | ORAL | Status: DC | PRN
  Filled 2019-05-19: qty 1

## 2019-05-19 MED ORDER — IBUPROFEN 200 MG PO TABS
ORAL_TABLET | ORAL | Status: AC
  Filled 2019-05-19: qty 4

## 2019-05-19 MED ORDER — CEFAZOLIN SODIUM-DEXTROSE 2-4 GM/100ML-% IV SOLN
2.0000 g | INTRAVENOUS | Status: AC
  Administered 2019-05-19: 07:00:00 2 g via INTRAVENOUS
  Filled 2019-05-19: qty 100

## 2019-05-19 MED ORDER — PROMETHAZINE HCL 25 MG/ML IJ SOLN
6.2500 mg | INTRAMUSCULAR | Status: DC | PRN
  Filled 2019-05-19: qty 1

## 2019-05-19 MED ORDER — ROCURONIUM BROMIDE 10 MG/ML (PF) SYRINGE
PREFILLED_SYRINGE | INTRAVENOUS | Status: DC | PRN
  Administered 2019-05-19: 50 mg via INTRAVENOUS

## 2019-05-19 MED ORDER — HYDROMORPHONE HCL 1 MG/ML IJ SOLN
0.2500 mg | INTRAMUSCULAR | Status: DC | PRN
  Administered 2019-05-19 (×2): 0.25 mg via INTRAVENOUS
  Filled 2019-05-19: qty 0.5

## 2019-05-19 MED ORDER — DEXAMETHASONE SODIUM PHOSPHATE 10 MG/ML IJ SOLN
INTRAMUSCULAR | Status: DC | PRN
  Administered 2019-05-19: 10 mg via INTRAVENOUS

## 2019-05-19 MED ORDER — MIDAZOLAM HCL 2 MG/2ML IJ SOLN
INTRAMUSCULAR | Status: DC | PRN
  Administered 2019-05-19: 2 mg via INTRAVENOUS

## 2019-05-19 MED ORDER — SUGAMMADEX SODIUM 200 MG/2ML IV SOLN
INTRAVENOUS | Status: DC | PRN
  Administered 2019-05-19: 200 mg via INTRAVENOUS

## 2019-05-19 MED ORDER — LACTATED RINGERS IV SOLN
INTRAVENOUS | Status: DC
  Administered 2019-05-19 (×2): via INTRAVENOUS
  Filled 2019-05-19: qty 1000

## 2019-05-19 MED ORDER — MEPERIDINE HCL 25 MG/ML IJ SOLN
6.2500 mg | INTRAMUSCULAR | Status: DC | PRN
  Filled 2019-05-19: qty 1

## 2019-05-19 MED ORDER — ROCURONIUM BROMIDE 10 MG/ML (PF) SYRINGE
PREFILLED_SYRINGE | INTRAVENOUS | Status: AC
  Filled 2019-05-19: qty 10

## 2019-05-19 MED ORDER — LACTATED RINGERS IV SOLN
INTRAVENOUS | Status: DC
  Filled 2019-05-19: qty 1000

## 2019-05-19 MED ORDER — DEXAMETHASONE SODIUM PHOSPHATE 10 MG/ML IJ SOLN
INTRAMUSCULAR | Status: AC
  Filled 2019-05-19: qty 1

## 2019-05-19 MED ORDER — FENTANYL CITRATE (PF) 250 MCG/5ML IJ SOLN
INTRAMUSCULAR | Status: AC
  Filled 2019-05-19: qty 5

## 2019-05-19 MED ORDER — LIDOCAINE 2% (20 MG/ML) 5 ML SYRINGE
INTRAMUSCULAR | Status: AC
  Filled 2019-05-19: qty 5

## 2019-05-19 MED ORDER — OXYCODONE HCL 5 MG PO TABS
5.0000 mg | ORAL_TABLET | ORAL | Status: DC | PRN
  Administered 2019-05-19 – 2019-05-20 (×5): 5 mg via ORAL
  Filled 2019-05-19: qty 2

## 2019-05-19 MED ORDER — MENTHOL 3 MG MT LOZG
1.0000 | LOZENGE | OROMUCOSAL | Status: DC | PRN
  Filled 2019-05-19: qty 9

## 2019-05-19 MED ORDER — HYDROMORPHONE HCL 1 MG/ML IJ SOLN
0.2000 mg | INTRAMUSCULAR | Status: DC | PRN
  Administered 2019-05-19: 0.25 mg via INTRAVENOUS
  Filled 2019-05-19: qty 1

## 2019-05-19 MED ORDER — ACETAMINOPHEN 325 MG PO TABS
ORAL_TABLET | ORAL | Status: DC | PRN
  Administered 2019-05-19: 1000 mg via ORAL

## 2019-05-19 MED ORDER — MIDAZOLAM HCL 2 MG/2ML IJ SOLN
INTRAMUSCULAR | Status: AC
  Filled 2019-05-19: qty 2

## 2019-05-19 MED ORDER — ONDANSETRON HCL 4 MG/2ML IJ SOLN
4.0000 mg | Freq: Four times a day (QID) | INTRAMUSCULAR | Status: DC | PRN
  Filled 2019-05-19: qty 2

## 2019-05-19 MED ORDER — IBUPROFEN 800 MG PO TABS
800.0000 mg | ORAL_TABLET | Freq: Three times a day (TID) | ORAL | Status: DC
  Administered 2019-05-19 – 2019-05-20 (×3): 800 mg via ORAL
  Filled 2019-05-19: qty 1

## 2019-05-19 MED ORDER — PHENYLEPHRINE HCL (PRESSORS) 10 MG/ML IV SOLN
INTRAVENOUS | Status: AC
  Filled 2019-05-19: qty 1

## 2019-05-19 MED ORDER — PROPOFOL 10 MG/ML IV BOLUS
INTRAVENOUS | Status: AC
  Filled 2019-05-19: qty 40

## 2019-05-19 MED ORDER — LACTATED RINGERS IV SOLN
INTRAVENOUS | Status: DC
  Administered 2019-05-19: 06:00:00 via INTRAVENOUS
  Filled 2019-05-19: qty 1000

## 2019-05-19 MED ORDER — KETOROLAC TROMETHAMINE 30 MG/ML IJ SOLN
INTRAMUSCULAR | Status: DC | PRN
  Administered 2019-05-19: 30 mg via INTRAVENOUS

## 2019-05-19 MED ORDER — KETOROLAC TROMETHAMINE 30 MG/ML IJ SOLN
30.0000 mg | Freq: Once | INTRAMUSCULAR | Status: DC | PRN
  Filled 2019-05-19: qty 1

## 2019-05-19 MED ORDER — SODIUM CHLORIDE 0.9 % IR SOLN
Status: DC | PRN
  Administered 2019-05-19: 2000 mL

## 2019-05-19 MED ORDER — KETOROLAC TROMETHAMINE 30 MG/ML IJ SOLN
INTRAMUSCULAR | Status: AC
  Filled 2019-05-19: qty 1

## 2019-05-19 MED ORDER — SCOPOLAMINE 1 MG/3DAYS TD PT72
MEDICATED_PATCH | TRANSDERMAL | Status: AC
  Filled 2019-05-19: qty 1

## 2019-05-19 MED ORDER — SIMETHICONE 80 MG PO CHEW
80.0000 mg | CHEWABLE_TABLET | Freq: Four times a day (QID) | ORAL | Status: DC | PRN
  Filled 2019-05-19: qty 1

## 2019-05-19 SURGICAL SUPPLY — 62 items
BAG RETRIEVAL 10MM (BASKET)
BENZOIN TINCTURE PRP APPL 2/3 (GAUZE/BANDAGES/DRESSINGS) ×5 IMPLANT
BLADE 10 SAFETY STRL DISP (BLADE) ×5 IMPLANT
CABLE HIGH FREQUENCY MONO STRZ (ELECTRODE) IMPLANT
CANISTER SUCT 3000ML PPV (MISCELLANEOUS) ×5 IMPLANT
CATH ROBINSON RED A/P 16FR (CATHETERS) ×5 IMPLANT
CELLS DAT CNTRL 66122 CELL SVR (MISCELLANEOUS) ×3 IMPLANT
CLOSURE WOUND 1/2 X4 (GAUZE/BANDAGES/DRESSINGS) ×1
CONT PATH 16OZ SNAP LID 3702 (MISCELLANEOUS) ×5 IMPLANT
COVER WAND RF STERILE (DRAPES) ×5 IMPLANT
DECANTER SPIKE VIAL GLASS SM (MISCELLANEOUS) IMPLANT
DERMABOND ADVANCED (GAUZE/BANDAGES/DRESSINGS) ×2
DERMABOND ADVANCED .7 DNX12 (GAUZE/BANDAGES/DRESSINGS) ×3 IMPLANT
DRAPE WARM FLUID 44X44 (DRAPES) ×5 IMPLANT
DRSG COVADERM PLUS 2X2 (GAUZE/BANDAGES/DRESSINGS) IMPLANT
DRSG OPSITE POSTOP 3X4 (GAUZE/BANDAGES/DRESSINGS) ×5 IMPLANT
DRSG OPSITE POSTOP 4X10 (GAUZE/BANDAGES/DRESSINGS) IMPLANT
DURAPREP 26ML APPLICATOR (WOUND CARE) ×5 IMPLANT
GAUZE 4X4 16PLY RFD (DISPOSABLE) ×5 IMPLANT
GLOVE BIO SURGEON STRL SZ7 (GLOVE) ×10 IMPLANT
GOWN STRL REUS W/TWL LRG LVL3 (GOWN DISPOSABLE) ×15 IMPLANT
NEEDLE HYPO 22GX1.5 SAFETY (NEEDLE) IMPLANT
NEEDLE INSUFFLATION 120MM (ENDOMECHANICALS) IMPLANT
NS IRRIG 1000ML POUR BTL (IV SOLUTION) ×10 IMPLANT
NS IRRIG 500ML POUR BTL (IV SOLUTION) ×5 IMPLANT
PACK ABDOMINAL GYN (CUSTOM PROCEDURE TRAY) ×5 IMPLANT
PACK LAPAROSCOPY BASIN (CUSTOM PROCEDURE TRAY) ×5 IMPLANT
PACK TRENDGUARD 450 HYBRID PRO (MISCELLANEOUS) IMPLANT
PAD OB MATERNITY 4.3X12.25 (PERSONAL CARE ITEMS) ×5 IMPLANT
PENCIL SMOKE EVAC W/HOLSTER (ELECTROSURGICAL) ×5 IMPLANT
PROTECTOR NERVE ULNAR (MISCELLANEOUS) ×10 IMPLANT
RETRACTOR WND ALEXIS 25 LRG (MISCELLANEOUS) ×3 IMPLANT
RTRCTR WOUND ALEXIS 18CM MED (MISCELLANEOUS) ×5
RTRCTR WOUND ALEXIS 25CM LRG (MISCELLANEOUS) ×5
SET IRRIG TUBING LAPAROSCOPIC (IRRIGATION / IRRIGATOR) IMPLANT
SHEARS HARMONIC ACE PLUS 36CM (ENDOMECHANICALS) IMPLANT
SPONGE LAP 18X18 RF (DISPOSABLE) ×15 IMPLANT
STRIP CLOSURE SKIN 1/2X4 (GAUZE/BANDAGES/DRESSINGS) ×4 IMPLANT
SUT PDS AB 0 CTX 60 (SUTURE) IMPLANT
SUT PLAIN 2 0 XLH (SUTURE) IMPLANT
SUT VIC AB 0 CT1 18XCR BRD8 (SUTURE) ×9 IMPLANT
SUT VIC AB 0 CT1 36 (SUTURE) ×10 IMPLANT
SUT VIC AB 0 CT1 8-18 (SUTURE) ×6
SUT VIC AB 2-0 CT1 27 (SUTURE) ×2
SUT VIC AB 2-0 CT1 TAPERPNT 27 (SUTURE) ×3 IMPLANT
SUT VIC AB 3-0 SH 27 (SUTURE) ×2
SUT VIC AB 3-0 SH 27X BRD (SUTURE) ×3 IMPLANT
SUT VIC AB 4-0 KS 27 (SUTURE) ×5 IMPLANT
SUT VIC AB 4-0 P2 18 (SUTURE) ×5 IMPLANT
SUT VICRYL 0 TIES 12 18 (SUTURE) ×5 IMPLANT
SUT VICRYL 0 UR6 27IN ABS (SUTURE) IMPLANT
SYR CONTROL 10ML LL (SYRINGE) IMPLANT
SYS BAG RETRIEVAL 10MM (BASKET)
SYSTEM BAG RETRIEVAL 10MM (BASKET) IMPLANT
TOWEL OR 17X26 10 PK STRL BLUE (TOWEL DISPOSABLE) ×5 IMPLANT
TRAY FOLEY W/BAG SLVR 14FR (SET/KITS/TRAYS/PACK) ×5 IMPLANT
TRENDGUARD 450 HYBRID PRO PACK (MISCELLANEOUS)
TROCAR BALLN 12MMX100 BLUNT (TROCAR) IMPLANT
TROCAR BLADELESS OPT 5 100 (ENDOMECHANICALS) ×10 IMPLANT
TROCAR XCEL NON-BLD 11X100MML (ENDOMECHANICALS) IMPLANT
TUBING EVAC SMOKE HEATED PNEUM (TUBING) ×5 IMPLANT
WARMER LAPAROSCOPE (MISCELLANEOUS) ×5 IMPLANT

## 2019-05-19 NOTE — Progress Notes (Signed)
Day of Surgery Procedure(s) (LRB): MINI LAPAROTOMY WITH RESECTION OF MASS (Bilateral) LEFT OVARIAN OOPHORECTOMY WITH PARTIAL SALPHENGECTOMY (Left) RIGHT OVARIAN CYSTECTOMY (Right)  Subjective: Patient reports tolerating PO.  She has ambulated x 1.  Foley still in.  Objective: I have reviewed patient's vital signs and intake and output.  General: alert and cooperative GI: soft NT Incision C/D/I  Assessment: s/p Procedure(s): MINI LAPAROTOMY WITH RESECTION OF MASS (Bilateral) LEFT OVARIAN OOPHORECTOMY WITH PARTIAL SALPHENGECTOMY (Left) RIGHT OVARIAN CYSTECTOMY (Right): progressing well  Plan: Advance diet Discontinue IV fluids d/c foley  Plan for d/c in early AM if meets all goals   LOS: 0 days    Logan Bores 05/19/2019, 5:13 PM

## 2019-05-19 NOTE — Anesthesia Postprocedure Evaluation (Signed)
Anesthesia Post Note  Patient: [place]  Procedure(s) Performed: MINI LAPAROTOMY WITH RESECTION OF MASS (Bilateral Abdomen) LEFT OVARIAN OOPHORECTOMY WITH PARTIAL SALPHENGECTOMY (Left Abdomen) RIGHT OVARIAN CYSTECTOMY (Right Abdomen)     Patient location during evaluation: PACU Anesthesia Type: General Level of consciousness: sedated Pain management: pain level controlled Vital Signs Assessment: post-procedure vital signs reviewed and stable Respiratory status: spontaneous breathing Cardiovascular status: stable Postop Assessment: no apparent nausea or vomiting Anesthetic complications: no    Last Vitals:  Vitals:   05/19/19 1008 05/19/19 1015  BP:  115/83  Pulse:  (!) 55  Resp:  15  Temp: 36.9 C   SpO2:  100%    Last Pain:  Vitals:   05/19/19 1015  TempSrc:   PainSc: 5    Pain Goal: Patients Stated Pain Goal: 4 (05/19/19 0537)                 Huston Foley

## 2019-05-19 NOTE — Progress Notes (Signed)
Patient ID: Mariah Butler, female   DOB: January 12, 1996, 23 y.o.   MRN: [id number] Per pt no changes in dictated H&P.  Brief exam WNL.  LMP 2 weeks ago, UPT negative and taking OCP's. Ready to proceed

## 2019-05-19 NOTE — Op Note (Signed)
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 The distal tube was not visible with fimbria, but looked to be tied up in this torsion event. Right ovary with 3-4cm dermoid cyst with hair which was carefully shelled out and ovarian tissue preserved.  Right tube was totally normal and healthy appearing.  Uterus and pelvis WNL otherwise  Specimen Left ovarian mass with likely some distal necrotic tube, right ovarian mass  Procedure Note Patient was taken to OR and general anesthesia obtained without difficulty.  She was then prepped and draped in the dorsal lithotomy position in the normal sterile fashion.  An appropriate timeout performed and a hulka tenaculum placed vaginally as well as a foley catheter.  Gowns were then changed and attention turned to the abdomen were a small transverse incision was made about 2cm above the pubis bone.  This was taken down to the fascia by Bovie cautery and the fascia opened in the midline.  The incision was then extended laterally with mayo scissors.  Coker clamps elevated the incision and dissected it off the underlying rectus muscles.  These were separated in the midline and the peritoneum entered sharply.  The alexis retractor was placed and the bowel packed away with moist sponges.  The left adnexa was identified as  described and was noted to be on a tiny pedicle that was essentially necrose off with no vascular supply apparent.  There was no normal ovarian tissue and it appeared to be partially necrotic.  Since the ovary was not able to be salvaged, the bovie separated the small pedicle and one free tie placed around it, though no bleeding noted.  The tube did not have any apparent remaining fimbria, but seemed tied up in the necrotic pedicle whic resulted in a distal salpingectomy. Attention was turned to the right ovary which had a 3 cm apparent dermoid.  This was elevated to the incision and carefully shelled out with sharp dissection.  The remaining ovarian tissue seemed normal and was closed with 2-0 vicryl.  The tube appeared normal and healthy. All pedicles were hemostatic. All instruments and sponges as well as the Alexis retractor were then removed from the abdomen. The rectus muscles and peritoneum were then reapproximated with a running suture of 2-0 Vicryl. The fascia was then closed with 0 Vicryl in a running fashion. Subcutaneous tissue was reapproximated with 2-0 vicryl  in a running fashion. The skin was closed with a subcuticular stitch of 4-0 Vicryl on a Keith needle and then reinforced with benzoin and Steri-Strips. At the conclusion of the procedure all instruments and sponge counts were correct. Patient was taken to the recovery room in good condition.

## 2019-05-19 NOTE — Anesthesia Procedure Notes (Signed)
Procedure Name: Intubation Date/Time: 05/19/2019 7:38 AM Performed by: Wanita Chamberlain, CRNA Pre-anesthesia Checklist: Patient identified, Emergency Drugs available, Suction available and Patient being monitored Patient Re-evaluated:Patient Re-evaluated prior to induction Oxygen Delivery Method: Circle system utilized Preoxygenation: Pre-oxygenation with 100% oxygen Induction Type: IV induction Ventilation: Mask ventilation without difficulty Grade View: Grade I Tube type: Oral Tube size: 7.0 mm Number of attempts: 1 Airway Equipment and Method: Stylet Placement Confirmation: breath sounds checked- equal and bilateral,  CO2 detector,  positive ETCO2 and ETT inserted through vocal cords under direct vision Secured at: 22 cm Tube secured with: Tape Dental Injury: Teeth and Oropharynx as per pre-operative assessment

## 2019-05-19 NOTE — Transfer of Care (Signed)
Immediate Anesthesia Transfer of Care Note  Patient: [place]  Procedure(s) Performed: MINI LAPAROTOMY WITH RESECTION OF MASS (Bilateral Abdomen) LEFT OVARIAN OOPHORECTOMY WITH PARTIAL SALPHENGECTOMY (Left Abdomen) RIGHT OVARIAN CYSTECTOMY (Right Abdomen)  Patient Location: PACU  Anesthesia Type:General  Level of Consciousness: awake, alert , oriented and patient cooperative  Airway & Oxygen Therapy: Patient Spontanous Breathing and Patient connected to nasal cannula oxygen  Post-op Assessment: Report given to RN and Post -op Vital signs reviewed and stable  Post vital signs: Reviewed and stable  Last Vitals:  Vitals Value Taken Time  BP 122/84 05/19/19 0917  Temp    Pulse 82 05/19/19 0918  Resp 14 05/19/19 0918  SpO2 100 % 05/19/19 0918  Vitals shown include unvalidated device data.  Last Pain:  Vitals:   05/19/19 0537  TempSrc: Oral  PainSc: 5       Patients Stated Pain Goal: 4 (59/29/24 4628)  Complications: No apparent anesthesia complications

## 2019-05-19 NOTE — Anesthesia Preprocedure Evaluation (Addendum)
Anesthesia Evaluation  Patient identified by MRN, date of birth, ID band Patient awake    Reviewed: Allergy & Precautions, NPO status , Patient's Chart, lab work & pertinent test results  Airway Mallampati: I       Dental no notable dental hx. (+) Teeth Intact, Dental Advisory Given   Pulmonary neg pulmonary ROS,    Pulmonary exam normal breath sounds clear to auscultation       Cardiovascular negative cardio ROS Normal cardiovascular exam Rhythm:Regular Rate:Normal     Neuro/Psych negative neurological ROS  negative psych ROS   GI/Hepatic negative GI ROS, Neg liver ROS,   Endo/Other  negative endocrine ROS  Renal/GU negative Renal ROS  negative genitourinary   Musculoskeletal negative musculoskeletal ROS (+)   Abdominal Normal abdominal exam  (+)   Peds negative pediatric ROS (+)  Hematology negative hematology ROS (+)   Anesthesia Other Findings   Reproductive/Obstetrics                            Anesthesia Physical Anesthesia Plan  ASA: I  Anesthesia Plan: General   Post-op Pain Management:    Induction: Intravenous  PONV Risk Score and Plan: 4 or greater and Ondansetron, Dexamethasone, Midazolam and Scopolamine patch - Pre-op  Airway Management Planned: Oral ETT  Additional Equipment: None  Intra-op Plan:   Post-operative Plan: Extubation in OR  Informed Consent: I have reviewed the patients History and Physical, chart, labs and discussed the procedure including the risks, benefits and alternatives for the proposed anesthesia with the patient or authorized representative who has indicated his/her understanding and acceptance.     Dental advisory given  Plan Discussed with: CRNA  Anesthesia Plan Comments:         Anesthesia Quick Evaluation

## 2019-05-19 NOTE — Discharge Summary (Signed)
Physician Discharge Summary  Patient ID: Mariah Butler MRN: [id number] DOB/AGE: 24/20/1997 24 y.o.  Admit date: 05/19/2019 Discharge date: 05/20/2019  Admission Diagnoses: Bilateral complex adnexal masses  Discharge Diagnoses:  Active Problems:   S/P laparotomy   Discharged Condition: good  Hospital Course: Pt admitted for observation s/p mini-laparotomy with left oophorectomy of necrotic appearing ovary and dist left fallopian tube as well as right ovarian cystectomy of dermoid.  Pt did well and by post-op day #1 was tolerating a regular diet, voiding well, and ambulting with po pain medication well-controlling pain.  Consults: None  Significant Diagnostic Studies: labs: CBC  Treatments: surgery: mini-laparotomy left oophorectomy/distal salpingectomy and right ovarian cystectomy  Discharge Exam: Blood pressure 103/65, pulse 68, temperature 98.6 F (37 C), resp. rate 14, height 5\' 5"  (1.651 m), weight 69.9 kg, last menstrual period 05/02/2019, SpO2 98 %. General appearance: alert and cooperative GI: soft NT Incision/Wound:C/D/I  Disposition: Discharge disposition: 01-Home or Self Care       Discharge Instructions    Call MD for:  persistant nausea and vomiting   Complete by: As directed    Call MD for:  redness, tenderness, or signs of infection (pain, swelling, redness, odor or green/yellow discharge around incision site)   Complete by: As directed    Call MD for:  severe uncontrolled pain   Complete by: As directed    Call MD for:  temperature >100.4   Complete by: As directed    Diet - low sodium heart healthy   Complete by: As directed    Discharge instructions   Complete by: As directed    Avoid driving for at least 1 week or until off narcotic pain meds.  No heavy lifting greater than 10 lbs.  Nothing in vagina for 3 days.  May remove bandage in 1-2 days.  Shower over incision and pat dry.     Allergies as of 05/20/2019   No Known Allergies      Medication List    TAKE these medications   acetaminophen 500 MG tablet Commonly known as: TYLENOL Take 2 tablets (1,000 mg total) by mouth every 6 (six) hours.   ALIVE WOMENS ENERGY PO Take by mouth daily. gummy   BOOST NUTRITIONAL ENERGY PO Take by mouth.   ibuprofen 800 MG tablet Commonly known as: ADVIL Take 1 tablet (800 mg total) by mouth every 8 (eight) hours. What changed:   medication strength  how much to take  when to take this  reasons to take this   oxyCODONE 5 MG immediate release tablet Commonly known as: Oxy IR/ROXICODONE Take 1-2 tablets (5-10 mg total) by mouth every 4 (four) hours as needed for moderate pain.   Sprintec 28 0.25-35 MG-MCG tablet Generic drug: norgestimate-ethinyl estradiol Take 1 tablet by mouth daily. NAME BRAND:  TRI--SPRINTEC      Follow-up Information    Paula Compton, MD. Schedule an appointment as soon as possible for a visit in 2 weeks.   Specialty: Obstetrics and Gynecology Why: incision check Contact information: [address] [place] [address] [phone]           Signed: Logan Bores 05/20/2019, 8:15 AM

## 2019-05-20 DIAGNOSIS — D271 Benign neoplasm of left ovary: Secondary | ICD-10-CM | POA: Diagnosis not present

## 2019-05-20 MED ORDER — IBUPROFEN 200 MG PO TABS
ORAL_TABLET | ORAL | Status: AC
  Filled 2019-05-20: qty 4

## 2019-05-20 MED ORDER — OXYCODONE HCL 5 MG PO TABS
ORAL_TABLET | ORAL | Status: AC
  Filled 2019-05-20: qty 1

## 2019-05-20 MED ORDER — ACETAMINOPHEN 500 MG PO TABS: 1000.0000 mg | ORAL_TABLET | Freq: Four times a day (QID) | ORAL | 0 refills | Status: AC

## 2019-05-20 MED ORDER — ACETAMINOPHEN 500 MG PO TABS
ORAL_TABLET | ORAL | Status: AC
  Filled 2019-05-20: qty 2

## 2019-05-20 MED ORDER — IBUPROFEN 800 MG PO TABS: 800.0000 mg | ORAL_TABLET | Freq: Three times a day (TID) | ORAL | 0 refills | Status: AC

## 2019-05-20 MED ORDER — OXYCODONE HCL 5 MG PO TABS: 5.0000 mg | ORAL_TABLET | ORAL | 0 refills | Status: AC | PRN

## 2019-05-20 NOTE — Progress Notes (Signed)
Patient ID: Mariah Butler, female   DOB: 1996-02-11, 23 y.o.   MRN: [id number] POD #1   Doing well.  Has been up ambulating, tolerating regular diet, and voiding fine Has had some bouts anxiety  afeb VSS  Abdomen soft approp tender Incision C/D/I  Doing well, d/c home

## 2019-05-23 ENCOUNTER — Encounter (HOSPITAL_BASED_OUTPATIENT_CLINIC_OR_DEPARTMENT_OTHER): Payer: Self-pay | Admitting: Obstetrics and Gynecology

## 2019-05-25 ENCOUNTER — Other Ambulatory Visit: Payer: Self-pay

## 2019-05-25 ENCOUNTER — Encounter (HOSPITAL_COMMUNITY): Payer: Self-pay | Admitting: Emergency Medicine

## 2019-05-25 ENCOUNTER — Emergency Department (HOSPITAL_COMMUNITY)
Admission: EM | Admit: 2019-05-25 | Discharge: 2019-05-26 | Disposition: A | Discharge status: Home / Self Care | Payer: BC Managed Care – PPO | Attending: Emergency Medicine | Admitting: Emergency Medicine

## 2019-05-25 DIAGNOSIS — R0602 Shortness of breath: Secondary | ICD-10-CM | POA: Insufficient documentation

## 2019-05-25 DIAGNOSIS — R109 Unspecified abdominal pain: Secondary | ICD-10-CM | POA: Diagnosis not present

## 2019-05-25 DIAGNOSIS — Z5321 Procedure and treatment not carried out due to patient leaving prior to being seen by health care provider: Secondary | ICD-10-CM | POA: Insufficient documentation

## 2019-05-25 LAB — CBC
HCT: 39.4 % (ref 36.0–46.0)
Hemoglobin: 12.9 g/dL (ref 12.0–15.0)
MCH: 33 pg (ref 26.0–34.0)
MCHC: 32.7 g/dL (ref 30.0–36.0)
MCV: 100.8 fL — ABNORMAL HIGH (ref 80.0–100.0)
Platelets: 281 10*3/uL (ref 150–400)
RBC: 3.91 MIL/uL (ref 3.87–5.11)
RDW: 11.9 % (ref 11.5–15.5)
WBC: 6.9 10*3/uL (ref 4.0–10.5)
nRBC: 0 % (ref 0.0–0.2)

## 2019-05-25 LAB — COMPREHENSIVE METABOLIC PANEL
ALT: 17 U/L (ref 0–44)
AST: 26 U/L (ref 15–41)
Albumin: 3.6 g/dL (ref 3.5–5.0)
Alkaline Phosphatase: 45 U/L (ref 38–126)
Anion gap: 7 (ref 5–15)
BUN: 15 mg/dL (ref 6–20)
CO2: 27 mmol/L (ref 22–32)
Calcium: 8.9 mg/dL (ref 8.9–10.3)
Chloride: 105 mmol/L (ref 98–111)
Creatinine, Ser: 0.86 mg/dL (ref 0.44–1.00)
GFR calc Af Amer: >60 mL/min (ref >60)
GFR calc non Af Amer: >60 mL/min (ref >60)
Glucose, Bld: 96 mg/dL (ref 70–99)
Potassium: 4.7 mmol/L (ref 3.5–5.1)
Sodium: 139 mmol/L (ref 135–145)
Total Bilirubin: 0.6 mg/dL (ref 0.3–1.2)
Total Protein: 7.6 g/dL (ref 6.5–8.1)

## 2019-05-25 LAB — LIPASE, BLOOD: Lipase: 39 U/L (ref 11–51)

## 2019-05-25 LAB — I-STAT BETA HCG BLOOD, ED (MC, WL, AP ONLY): I-stat hCG, quantitative: <5 m[IU]/mL (ref <5)

## 2019-05-25 NOTE — ED Triage Notes (Signed)
Pt had surgery last Thursday to remove a cyst off ovary. Pt still having abd pains and SOB.  Mother, Rollene Fare, [phone], can be reached if needed.

## 2019-05-25 NOTE — ED Notes (Signed)
Roll call for pt status in ED lobby, no answer. [employer]

## 2019-05-31 ENCOUNTER — Encounter (HOSPITAL_COMMUNITY): Payer: Self-pay

## 2019-05-31 ENCOUNTER — Emergency Department (HOSPITAL_COMMUNITY)
Admission: EM | Admit: 2019-05-31 | Discharge: 2019-05-31 | Disposition: A | Discharge status: Home / Self Care | Payer: BC Managed Care – PPO | Attending: Emergency Medicine | Admitting: Emergency Medicine

## 2019-05-31 DIAGNOSIS — F419 Anxiety disorder, unspecified: Secondary | ICD-10-CM | POA: Diagnosis not present

## 2019-05-31 MED ORDER — HYDROXYZINE HCL 25 MG PO TABS: 25.0000 mg | ORAL_TABLET | Freq: Four times a day (QID) | ORAL | 0 refills | Status: AC
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
## 2019-05-31 NOTE — Discharge Instructions (Signed)
You were seen in the ER for increased anxiety and possible panic attacks.   I have given you hydroxyzine to take as needed and/or every 6 hours. Take during increased anxiety, panic. This medicine may help prevent a full panic attack and help with anxiety.   Further management of anxiety includes talk therapy and/or medications.  Call [facility] for more evaluation and treatment.  [facility] [email or web address] [address], [place]  [address] [phone] to schedule an appointment  [facility]) Tier IV is a walk-in service for people with mental health or substance use disorders who are experiencing a behavioral health crisis. A multidisciplinary team provides a clinical assessment, disposition and discharge planning. [facility] also offers medication management, outpatient therapy, case management and other related services to ensure that people are linked to the least restrictive and most appropriate level of care that promotes their continued recovery.  Referrals may be made to the facility-based crisis center 24/7, 365 days a year. Walk-ins are always welcome or you may call ahead to speak with a [facility] staff member regarding availability (preferred method).

## 2019-06-01 ENCOUNTER — Other Ambulatory Visit: Payer: Self-pay

## 2019-06-01 DIAGNOSIS — Z20828 Contact with and (suspected) exposure to other viral communicable diseases: Secondary | ICD-10-CM | POA: Diagnosis not present

## 2019-06-01 DIAGNOSIS — Z791 Long term (current) use of non-steroidal anti-inflammatories (NSAID): Secondary | ICD-10-CM | POA: Diagnosis not present

## 2019-06-01 DIAGNOSIS — R253 Fasciculation: Secondary | ICD-10-CM | POA: Diagnosis not present

## 2019-06-01 DIAGNOSIS — F419 Anxiety disorder, unspecified: Secondary | ICD-10-CM | POA: Diagnosis not present

## 2019-06-01 DIAGNOSIS — Z79899 Other long term (current) drug therapy: Secondary | ICD-10-CM | POA: Diagnosis not present

## 2019-06-01 DIAGNOSIS — F985 Adult onset fluency disorder: Secondary | ICD-10-CM | POA: Diagnosis not present

## 2019-06-01 DIAGNOSIS — R200 Anesthesia of skin: Secondary | ICD-10-CM | POA: Insufficient documentation

## 2019-06-01 DIAGNOSIS — R4701 Aphasia: Secondary | ICD-10-CM | POA: Diagnosis not present

## 2019-06-01 DIAGNOSIS — G9340 Encephalopathy, unspecified: Secondary | ICD-10-CM | POA: Diagnosis present

## 2019-06-01 DIAGNOSIS — R251 Tremor, unspecified: Secondary | ICD-10-CM | POA: Diagnosis not present

## 2019-06-01 DIAGNOSIS — D649 Anemia, unspecified: Secondary | ICD-10-CM | POA: Insufficient documentation

## 2019-06-01 DIAGNOSIS — R29898 Other symptoms and signs involving the musculoskeletal system: Secondary | ICD-10-CM | POA: Diagnosis present

## 2019-06-01 DIAGNOSIS — R531 Weakness: Secondary | ICD-10-CM | POA: Insufficient documentation

## 2019-06-02 ENCOUNTER — Observation Stay (HOSPITAL_COMMUNITY): Payer: BC Managed Care – PPO

## 2019-06-02 ENCOUNTER — Observation Stay (HOSPITAL_COMMUNITY)
Admit: 2019-06-02 | Discharge: 2019-06-02 | Disposition: A | Discharge status: Home / Self Care | Payer: BC Managed Care – PPO | Attending: Internal Medicine | Admitting: Internal Medicine

## 2019-06-02 ENCOUNTER — Encounter (HOSPITAL_COMMUNITY): Payer: Self-pay | Admitting: Emergency Medicine

## 2019-06-02 ENCOUNTER — Emergency Department (HOSPITAL_COMMUNITY): Payer: BC Managed Care – PPO

## 2019-06-02 ENCOUNTER — Observation Stay (HOSPITAL_COMMUNITY)
Admission: EM | Admit: 2019-06-02 | Discharge: 2019-06-03 | Discharge status: Against Medical Advice | Payer: BC Managed Care – PPO | Attending: Family Medicine | Admitting: Family Medicine

## 2019-06-02 ENCOUNTER — Other Ambulatory Visit: Payer: Self-pay

## 2019-06-02 DIAGNOSIS — R4182 Altered mental status, unspecified: Secondary | ICD-10-CM | POA: Diagnosis present

## 2019-06-02 DIAGNOSIS — R253 Fasciculation: Secondary | ICD-10-CM | POA: Diagnosis not present

## 2019-06-02 DIAGNOSIS — G9340 Encephalopathy, unspecified: Secondary | ICD-10-CM | POA: Diagnosis not present

## 2019-06-02 DIAGNOSIS — R251 Tremor, unspecified: Secondary | ICD-10-CM | POA: Diagnosis present

## 2019-06-02 DIAGNOSIS — D649 Anemia, unspecified: Secondary | ICD-10-CM | POA: Diagnosis present

## 2019-06-02 DIAGNOSIS — R29898 Other symptoms and signs involving the musculoskeletal system: Secondary | ICD-10-CM

## 2019-06-02 LAB — CBC WITH DIFFERENTIAL/PLATELET
Abs Immature Granulocytes: 0.05 10*3/uL (ref 0.00–0.07)
Basophils Absolute: 0.1 10*3/uL (ref 0.0–0.1)
Basophils Relative: 1 %
Eosinophils Absolute: 0.2 10*3/uL (ref 0.0–0.5)
Eosinophils Relative: 2 %
HCT: 34.1 % — ABNORMAL LOW (ref 36.0–46.0)
Hemoglobin: 11 g/dL — ABNORMAL LOW (ref 12.0–15.0)
Immature Granulocytes: 1 %
Lymphocytes Relative: 31 %
Lymphs Abs: 2.8 10*3/uL (ref 0.7–4.0)
MCH: 32.5 pg (ref 26.0–34.0)
MCHC: 32.3 g/dL (ref 30.0–36.0)
MCV: 100.9 fL — ABNORMAL HIGH (ref 80.0–100.0)
Monocytes Absolute: 0.8 10*3/uL (ref 0.1–1.0)
Monocytes Relative: 8 %
Neutro Abs: 5.1 10*3/uL (ref 1.7–7.7)
Neutrophils Relative %: 57 %
Platelets: 263 10*3/uL (ref 150–400)
RBC: 3.38 MIL/uL — ABNORMAL LOW (ref 3.87–5.11)
RDW: 11.8 % (ref 11.5–15.5)
WBC: 9 10*3/uL (ref 4.0–10.5)
nRBC: 0 % (ref 0.0–0.2)

## 2019-06-02 LAB — BASIC METABOLIC PANEL
Anion gap: 8 (ref 5–15)
BUN: 20 mg/dL (ref 6–20)
CO2: 23 mmol/L (ref 22–32)
Calcium: 8.7 mg/dL — ABNORMAL LOW (ref 8.9–10.3)
Chloride: 107 mmol/L (ref 98–111)
Creatinine, Ser: 0.88 mg/dL (ref 0.44–1.00)
GFR calc Af Amer: >60 mL/min (ref >60)
GFR calc non Af Amer: >60 mL/min (ref >60)
Glucose, Bld: 94 mg/dL (ref 70–99)
Potassium: 4.5 mmol/L (ref 3.5–5.1)
Sodium: 138 mmol/L (ref 135–145)

## 2019-06-02 LAB — GLUCOSE, CAPILLARY: Glucose-Capillary: 89 mg/dL (ref 70–99)

## 2019-06-02 LAB — URINALYSIS, ROUTINE W REFLEX MICROSCOPIC
Bilirubin Urine: NEGATIVE
Glucose, UA: NEGATIVE mg/dL
Hgb urine dipstick: NEGATIVE
Ketones, ur: NEGATIVE mg/dL
Nitrite: NEGATIVE
Protein, ur: NEGATIVE mg/dL
Specific Gravity, Urine: 1.018 (ref 1.005–1.030)
pH: 7 (ref 5.0–8.0)

## 2019-06-02 LAB — TSH: TSH: 2.756 u[IU]/mL (ref 0.350–4.500)

## 2019-06-02 LAB — VITAMIN B12: Vitamin B-12: 508 pg/mL (ref 180–914)

## 2019-06-02 LAB — SEDIMENTATION RATE: Sed Rate: 40 mm/hr — ABNORMAL HIGH (ref 0–22)

## 2019-06-02 LAB — RAPID URINE DRUG SCREEN, HOSP PERFORMED
Amphetamines: NOT DETECTED
Barbiturates: NOT DETECTED
Benzodiazepines: POSITIVE — AB
Cocaine: NOT DETECTED
Opiates: NOT DETECTED
Tetrahydrocannabinol: NOT DETECTED

## 2019-06-02 LAB — ETHANOL: Alcohol, Ethyl (B): <10 mg/dL (ref <10)

## 2019-06-02 LAB — SARS CORONAVIRUS 2 BY RT PCR (HOSPITAL ORDER, PERFORMED IN ~~LOC~~ HOSPITAL LAB): SARS Coronavirus 2: NEGATIVE

## 2019-06-02 MED ORDER — SODIUM CHLORIDE 0.9 % IV SOLN: 250.0000 mL | INTRAVENOUS | Status: DC | PRN

## 2019-06-02 MED ORDER — LORAZEPAM 2 MG/ML IJ SOLN
1.0000 mg | Freq: Once | INTRAMUSCULAR | Status: AC
  Administered 2019-06-02: 1 mg via INTRAVENOUS
  Filled 2019-06-02: qty 1

## 2019-06-02 MED ORDER — SODIUM CHLORIDE 0.9 % IV SOLN: INTRAVENOUS | Status: DC

## 2019-06-02 MED ORDER — ACETAMINOPHEN 325 MG PO TABS: 650.0000 mg | ORAL_TABLET | Freq: Four times a day (QID) | ORAL | Status: DC | PRN

## 2019-06-02 MED ORDER — ENOXAPARIN SODIUM 40 MG/0.4ML ~~LOC~~ SOLN: 40.0000 mg | SUBCUTANEOUS | Status: DC

## 2019-06-02 MED ORDER — SODIUM CHLORIDE 0.9% FLUSH
3.0000 mL | Freq: Two times a day (BID) | INTRAVENOUS | Status: DC
  Administered 2019-06-02: 3 mL via INTRAVENOUS

## 2019-06-02 MED ORDER — DIPHENHYDRAMINE HCL 50 MG/ML IJ SOLN
25.0000 mg | Freq: Once | INTRAMUSCULAR | Status: AC
  Administered 2019-06-02: 25 mg via INTRAVENOUS
  Filled 2019-06-02: qty 1

## 2019-06-02 MED ORDER — SODIUM CHLORIDE 0.9 % IV SOLN
INTRAVENOUS | Status: AC
  Administered 2019-06-02: 09:00:00 via INTRAVENOUS

## 2019-06-02 MED ORDER — SODIUM CHLORIDE 0.9% FLUSH
3.0000 mL | INTRAVENOUS | Status: DC | PRN
  Administered 2019-06-02: 3 mL via INTRAVENOUS
  Filled 2019-06-02: qty 3

## 2019-06-02 MED ORDER — GADOBUTROL 1 MMOL/ML IV SOLN
7.0000 mL | Freq: Once | INTRAVENOUS | Status: AC | PRN
  Administered 2019-06-02: 7 mL via INTRAVENOUS

## 2019-06-02 MED ORDER — DIPHENHYDRAMINE HCL 50 MG/ML IJ SOLN: 50.0000 mg | Freq: Once | INTRAMUSCULAR | Status: DC

## 2019-06-02 MED ORDER — ACETAMINOPHEN 650 MG RE SUPP: 650.0000 mg | Freq: Four times a day (QID) | RECTAL | Status: DC | PRN

## 2019-06-02 NOTE — ED Notes (Signed)
Pt unable to give urine sample at this time 

## 2019-06-02 NOTE — Progress Notes (Signed)
Pt observed with spastic movements, right greater than left, when awake.  While sleeping, pt has involuntary movement of head and muttering.  Mom at bedside. Andre Lefort

## 2019-06-02 NOTE — Progress Notes (Signed)
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  Neurology will sign off, please call with further questions or concerns.  Roland Rack, MD [facility] [phone]  If 7pm- 7am, please page neurology on call as listed in [place].
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
## 2019-06-02 NOTE — Progress Notes (Signed)
EEG complete - results pending 

## 2019-06-02 NOTE — ED Notes (Signed)
Patient ambulated to bathroom w/ this writers assistance. Patient ambulated w/ a steady gate.
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
## 2019-06-02 NOTE — Progress Notes (Signed)
Pt states she is becoming aggravated and upset that she's not able to eat or drink anything, states she has been waiting on test to be performed all day. Requesting to have something.  Dr. Maudie Mercury notified.   States to keep order as is, NPO except sips with meds.
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
## 2019-06-02 NOTE — Consult Note (Addendum)
Neurology Consultation  Reason for Consult: Abnormal movements possibly dystonic reaction Referring Physician: Sarajane Jews   History is obtained from: Chart  HPI: Mariah Butler is a 23 y.o. female with history of pelvic pain, excision of pilonidal cyst, concussion, adnexal mass.  Patient had a left oophorectomy and distal salpingectomy and right ovarian cystectomy on 05/19/2019.  Patient came into the emergency room complaining of right arm shaking for 2 days.  Patient had apparently taken for Atarax.  She reported while in the ED that she had right greater than left shaking of her legs which made her mother concerned.  In addition she seemed to have difficulty speaking.  While in the ED patient was having difficulty speaking per note, and nodded her head that she had right arm numbness and left leg numbness.  Mother stated that she was having inappropriate responses at home sometimes laughing and sometimes sad.  During consultation patient is awake, and alert.  When speaking she does significant stuttering of speech and then starts moaning and singing.  She is able to tell me that she recently had surgery.  She does not not have any numbness tingling at this time and she states that she has not had any shaking of her leg.    ROS: A 14 point ROS was performed and is negative except as noted in the HPI.  Past Medical History:  Diagnosis Date  . Adnexal mass   . History of concussion    per pt approx. 2014, no loc,  no residual  . History of excision of pilonidal cyst 2017  . Pelvic pain   . Wears glasses      Family History  Problem Relation Age of Onset  . Hypertension Mother   . Hypertension Father    Social History:   reports that she has never smoked. She has never used smokeless tobacco. She reports previous alcohol use. She reports current drug use. Drug: Marijuana.  Medications  Current Facility-Administered Medications:  .  0.9 %  sodium chloride infusion, 250 mL,  Intravenous, PRN, Jani Gravel, MD .  0.9 %  sodium chloride infusion, , Intravenous, Continuous, Jani Gravel, MD, Last Rate: 75 mL/hr at 06/02/19 0925 .  acetaminophen (TYLENOL) tablet 650 mg, 650 mg, Oral, Q6H PRN **OR** acetaminophen (TYLENOL) suppository 650 mg, 650 mg, Rectal, Q6H PRN, Jani Gravel, MD .  sodium chloride flush (NS) 0.9 % injection 3 mL, 3 mL, Intravenous, Q12H, Jani Gravel, MD, 3 mL at 06/02/19 0925 .  sodium chloride flush (NS) 0.9 % injection 3 mL, 3 mL, Intravenous, PRN, Jani Gravel, MD, 3 mL at 06/02/19 1027  Current Outpatient Medications:  .  acetaminophen (TYLENOL) 500 MG tablet, Take 2 tablets (1,000 mg total) by mouth every 6 (six) hours., Disp: 30 tablet, Rfl: 0 .  hydrOXYzine (ATARAX/VISTARIL) 25 MG tablet, Take 1 tablet (25 mg total) by mouth every 6 (six) hours., Disp: 12 tablet, Rfl: 0 .  ibuprofen (ADVIL) 800 MG tablet, Take 1 tablet (800 mg total) by mouth every 8 (eight) hours., Disp: 30 tablet, Rfl: 0 .  Multiple Vitamins-Minerals (ALIVE WOMENS ENERGY PO), Take 1 tablet by mouth daily. gummy , Disp: , Rfl:  .  norgestimate-ethinyl estradiol (SPRINTEC 28) 0.25-35 MG-MCG tablet, Take 1 tablet by mouth daily. NAME BRAND:  TRI--SPRINTEC, Disp: , Rfl:  .  Nutritional Supplements (BOOST NUTRITIONAL ENERGY PO), Take 1 tablet by mouth daily. , Disp: , Rfl:  .  oxyCODONE (OXY IR/ROXICODONE) 5 MG immediate release tablet, Take 1-2 tablets (  5-10 mg total) by mouth every 4 (four) hours as needed for moderate pain., Disp: 30 tablet, Rfl: 0   Exam: Current vital signs: BP 109/71 (BP Location: Left Arm)   Pulse 71   Temp 99.2 F (37.3 C) (Oral)   Resp 16   Ht 5\' 6"  (1.676 m)   Wt 68 kg   LMP 05/24/2019   SpO2 100%   BMI 24.21 kg/m  Vital signs in last 24 hours: Temp:  [99.2 F (37.3 C)] 99.2 F (37.3 C) (08/05 2333) Pulse Rate:  [68-78] 71 (08/06 0904) Resp:  [14-18] 16 (08/06 0904) BP: (100-116)/(64-78) 109/71 (08/06 0904) SpO2:  [95 %-100 %] 100 % (08/06  0904) Weight:  [68 kg] 68 kg (08/05 2333)  Physical Exam  Constitutional: Appears well-developed and well-nourished.  Psych: Affect appropriate to situation Eyes: No scleral injection HENT: No OP obstrucion Head: Normocephalic.  Cardiovascular: Normal rate and regular rhythm.  Respiratory: Effort normal, non-labored breathing GI: Soft.  No distension. There is no tenderness.  Skin: WDI  Neuro: Mental Status: Patient is awake alert.  She is oriented to hospital.  She is able to follow commands. Cranial Nerves: II: Visual Fields are full.  III,IV, VI: EOMI without ptosis or diploplia. Pupils equal, round and reactive to light V: Facial sensation is symmetric to temperature VII: Facial movement is symmetric.  VIII: hearing is intact to voice X: Palat elevates symmetrically XI: Shoulder shrug is symmetric. XII: Patient barely opens her mouth but her tongue is midline Motor: Tone is normal. Bulk is normal. 5/5 strength was present in all four extremities.  When entering the room patient is noted to have [place] no tremors while talking to her.  However during exam when I asked her to move her arms and touch her nose.  She started to have a tremor however, when I placed my hand on her arm and distracted her this stopped.  She has 5/5 strength throughout Sensory: Sensation is symmetric to light touch and temperature in the arms and legs. Deep Tendon Reflexes: 2+ and symmetric in the biceps and patellae.  Plantars: Toes are downgoing bilaterally.  Cerebellar: FNF shows no dysmetria, shows no ataxia  Labs I have reviewed labs in epic and the results pertinent to this consultation are:   CBC    Component Value Date/Time   WBC 9.0 06/02/2019 0050   RBC 3.38 (L) 06/02/2019 0050   HGB 11.0 (L) 06/02/2019 0050   HCT 34.1 (L) 06/02/2019 0050   PLT 263 06/02/2019 0050   MCV 100.9 (H) 06/02/2019 0050   MCH 32.5 06/02/2019 0050   MCHC 32.3 06/02/2019 0050   RDW 11.8 06/02/2019 0050    LYMPHSABS 2.8 06/02/2019 0050   MONOABS 0.8 06/02/2019 0050   EOSABS 0.2 06/02/2019 0050   BASOSABS 0.1 06/02/2019 0050    CMP     Component Value Date/Time   NA 138 06/02/2019 0050   K 4.5 06/02/2019 0050   CL 107 06/02/2019 0050   CO2 23 06/02/2019 0050   GLUCOSE 94 06/02/2019 0050   BUN 20 06/02/2019 0050   CREATININE 0.88 06/02/2019 0050   CALCIUM 8.7 (L) 06/02/2019 0050   PROT 7.6 05/25/2019 1411   ALBUMIN 3.6 05/25/2019 1411   AST 26 05/25/2019 1411   ALT 17 05/25/2019 1411   ALKPHOS 45 05/25/2019 1411   BILITOT 0.6 05/25/2019 1411   GFRNONAA >60 06/02/2019 0050   GFRAA >60 06/02/2019 0050    Lipid Panel  No results found for:  CHOL, TRIG, HDL, CHOLHDL, VLDL, LDLCALC, LDLDIRECT   Imaging I have reviewed the images obtained:  CT-scan of the brain- normal head CT  MRI examination of the brain- motion degraded but no intracranial mass or evidence of acute intracranial abnormality  Etta Quill PA-C [facility] [phone]  M-F  (9:00 am- 5:00 PM)  06/02/2019, 11:05 AM   I have seen the patient and reviewed the above note.  She has a markedly inconsistent exam with distractible tremor, distractible stutter.   Assessment:  23 year old female presenting to the ED secondary to complaints of numbness and difficulty speaking.  On exam she has significant stuttering along with mumbling which turns into humming.  Patient has a tremor which is distractible.  At this time, given normal MRI and CT, and distractible tremor along with stuttering of speech.  Most likely nonorganic however will obtain EEG to fully finish work-up.   Recommendations: -Obtain EEG -If normal no further diagnostic work-up, from a neuro standpoint  --If she continues to be symptomatic, consider psychiatric evaluation.  Roland Rack, MD [facility] [phone]  If 7pm- 7am, please page neurology on call as listed in [place].

## 2019-06-02 NOTE — ED Notes (Signed)
This writer attempted to call report x 1. This Probation officer was told by floor that they will call back for report.

## 2019-06-02 NOTE — ED Notes (Signed)
Patient transported to MRI 

## 2019-06-02 NOTE — ED Triage Notes (Signed)
Patient is complaining of shaking and would not stop. Patient did not get better. This started tonight.
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
## 2019-06-02 NOTE — ED Notes (Signed)
Admitting Md at bedside

## 2019-06-02 NOTE — Procedures (Addendum)
Patient Name: Mariah Butler  MRN: [id number]  Epilepsy Attending: Lora Havens  Referring Physician/Provider: Dr. Jani Gravel Date: 06/02/2019 Duration: 24.10 minutes  Patient history: 23 year old female came in with right-sided shaking.  EEG to evaluate for seizures.  Level of alertness: Awake and asleep  Technical aspects: This EEG study was done with scalp electrodes positioned according to the 10-20 International system of electrode placement. Electrical activity was acquired at a sampling rate of 500Hz  and reviewed with a high frequency filter of 70Hz  and a low frequency filter of 1Hz . EEG data were recorded continuously and digitally stored.   Description: The posterior dominant rhythm consists of 10-11Hz  activity of moderate voltage (25-35 uV) seen predominantly in posterior head regions, symmetric and reactive to eye opening and eye closing.  Sleep was characterized by vertex waves, sleep spindles (12 to 14 Hz, K complexes maximal frontocentral region. Hyperventilation and photic stimulation were not performed.  IMPRESSION: This study is within normal limits. No seizures or epileptiform discharges were seen throughout the recording.  Trindon Dorton Barbra Sarks
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
## 2019-06-03 LAB — ANA: Anti Nuclear Antibody (ANA): NEGATIVE

## 2019-06-03 LAB — RPR: RPR Ser Ql: NONREACTIVE

## 2019-06-03 NOTE — Progress Notes (Signed)
Pt states that she is going to leave, states she is going to call her mom.  Tried to reason with patient to stay until morning so that swallowing eval.  could be done.  Pt states that is going home.  Dr. Maudie Mercury notified, states o.k.  Received call from mother stating she was on her way to get patient.   I.V. site removed and pressure dressing applied.
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
## 2019-11-19 ENCOUNTER — Emergency Department (HOSPITAL_COMMUNITY)
Admission: EM | Admit: 2019-11-19 | Discharge: 2019-11-19 | Discharge status: Against Medical Advice | Payer: BC Managed Care – PPO

## 2019-11-19 NOTE — ED Notes (Signed)
Pt called for vital signs, no response from the lobby

## 2020-04-30 ENCOUNTER — Encounter (HOSPITAL_COMMUNITY): Payer: Self-pay | Admitting: Emergency Medicine

## 2020-04-30 ENCOUNTER — Other Ambulatory Visit: Payer: Self-pay

## 2020-04-30 ENCOUNTER — Emergency Department (HOSPITAL_COMMUNITY)
Admission: EM | Admit: 2020-04-30 | Discharge: 2020-04-30 | Disposition: A | Discharge status: Home / Self Care | Payer: BC Managed Care – PPO | Attending: Emergency Medicine | Admitting: Emergency Medicine

## 2020-04-30 DIAGNOSIS — L232 Allergic contact dermatitis due to cosmetics: Secondary | ICD-10-CM | POA: Diagnosis not present

## 2020-04-30 DIAGNOSIS — T7840XA Allergy, unspecified, initial encounter: Secondary | ICD-10-CM | POA: Diagnosis present

## 2020-04-30 MED ORDER — DIPHENHYDRAMINE HCL 25 MG PO CAPS
25.0000 mg | ORAL_CAPSULE | Freq: Once | ORAL | Status: AC
  Administered 2020-04-30: 25 mg via ORAL
  Filled 2020-04-30: qty 1
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
## 2020-04-30 NOTE — Discharge Instructions (Signed)
Can continue benadryl as needed for itching. Recommend to use gentle face soap like eucerin, cetaphil.  Something unscented with low chemicals is best. Can use hydrocortisone on the face if needed-- avoid direct contact with the eyes and use a very small amount. Follow-up with your primary care doctor. Return here for any new/acute changes.

## 2020-04-30 NOTE — ED Triage Notes (Signed)
Patient used facial wash that she thinks broke her out. Patient states she is itching. Patient did not take benadryl.

## 2022-02-27 DIAGNOSIS — H5371 Glare sensitivity: Secondary | ICD-10-CM | POA: Diagnosis not present

## 2022-03-28 DIAGNOSIS — Z13220 Encounter for screening for lipoid disorders: Secondary | ICD-10-CM | POA: Diagnosis not present

## 2022-03-28 DIAGNOSIS — L309 Dermatitis, unspecified: Secondary | ICD-10-CM | POA: Diagnosis not present

## 2022-03-28 DIAGNOSIS — F411 Generalized anxiety disorder: Secondary | ICD-10-CM | POA: Diagnosis not present

## 2022-03-28 DIAGNOSIS — Z0000 Encounter for general adult medical examination without abnormal findings: Secondary | ICD-10-CM | POA: Diagnosis not present

## 2022-04-09 DIAGNOSIS — H53149 Visual discomfort, unspecified: Secondary | ICD-10-CM | POA: Diagnosis not present

## 2022-11-26 DIAGNOSIS — Z113 Encounter for screening for infections with a predominantly sexual mode of transmission: Secondary | ICD-10-CM | POA: Diagnosis not present

## 2023-04-10 DIAGNOSIS — Z13220 Encounter for screening for lipoid disorders: Secondary | ICD-10-CM | POA: Diagnosis not present

## 2023-04-10 DIAGNOSIS — Z23 Encounter for immunization: Secondary | ICD-10-CM | POA: Diagnosis not present

## 2023-04-10 DIAGNOSIS — Z0000 Encounter for general adult medical examination without abnormal findings: Secondary | ICD-10-CM | POA: Diagnosis not present

## 2023-04-10 DIAGNOSIS — Z113 Encounter for screening for infections with a predominantly sexual mode of transmission: Secondary | ICD-10-CM | POA: Diagnosis not present

## 2024-04-18 DIAGNOSIS — Z13220 Encounter for screening for lipoid disorders: Secondary | ICD-10-CM | POA: Diagnosis not present

## 2024-04-18 DIAGNOSIS — Z0000 Encounter for general adult medical examination without abnormal findings: Secondary | ICD-10-CM | POA: Diagnosis not present

## 2024-04-18 DIAGNOSIS — F411 Generalized anxiety disorder: Secondary | ICD-10-CM | POA: Diagnosis not present

## 2024-06-21 DIAGNOSIS — F1210 Cannabis abuse, uncomplicated: Secondary | ICD-10-CM | POA: Diagnosis not present

## 2024-06-21 DIAGNOSIS — F410 Panic disorder [episodic paroxysmal anxiety] without agoraphobia: Secondary | ICD-10-CM | POA: Diagnosis not present

## 2024-06-21 DIAGNOSIS — F332 Major depressive disorder, recurrent severe without psychotic features: Secondary | ICD-10-CM | POA: Diagnosis not present

## 2024-06-21 DIAGNOSIS — Z5989 Other problems related to housing and economic circumstances: Secondary | ICD-10-CM | POA: Diagnosis not present

## 2024-06-21 DIAGNOSIS — Z8742 Personal history of other diseases of the female genital tract: Secondary | ICD-10-CM | POA: Diagnosis not present

## 2024-06-21 DIAGNOSIS — F17290 Nicotine dependence, other tobacco product, uncomplicated: Secondary | ICD-10-CM | POA: Diagnosis not present

## 2024-06-21 DIAGNOSIS — Z6379 Other stressful life events affecting family and household: Secondary | ICD-10-CM | POA: Diagnosis not present

## 2024-06-21 DIAGNOSIS — Z638 Other specified problems related to primary support group: Secondary | ICD-10-CM | POA: Diagnosis not present

## 2024-06-21 DIAGNOSIS — R45851 Suicidal ideations: Secondary | ICD-10-CM | POA: Diagnosis not present

## 2024-06-21 DIAGNOSIS — Z9151 Personal history of suicidal behavior: Secondary | ICD-10-CM | POA: Diagnosis not present

## 2024-06-21 DIAGNOSIS — F1290 Cannabis use, unspecified, uncomplicated: Secondary | ICD-10-CM | POA: Diagnosis not present

## 2024-06-21 DIAGNOSIS — Z5986 Financial insecurity: Secondary | ICD-10-CM | POA: Diagnosis not present

## 2024-06-26 DIAGNOSIS — R42 Dizziness and giddiness: Secondary | ICD-10-CM | POA: Diagnosis not present

## 2024-06-26 DIAGNOSIS — R519 Headache, unspecified: Secondary | ICD-10-CM | POA: Diagnosis not present

## 2024-06-26 DIAGNOSIS — R202 Paresthesia of skin: Secondary | ICD-10-CM | POA: Diagnosis not present

## 2024-06-26 DIAGNOSIS — R1032 Left lower quadrant pain: Secondary | ICD-10-CM | POA: Diagnosis not present

## 2024-06-26 DIAGNOSIS — F32A Depression, unspecified: Secondary | ICD-10-CM | POA: Diagnosis not present

## 2024-06-26 DIAGNOSIS — R0602 Shortness of breath: Secondary | ICD-10-CM | POA: Diagnosis not present

## 2024-06-26 DIAGNOSIS — F419 Anxiety disorder, unspecified: Secondary | ICD-10-CM | POA: Diagnosis not present

## 2024-06-26 DIAGNOSIS — F410 Panic disorder [episodic paroxysmal anxiety] without agoraphobia: Secondary | ICD-10-CM | POA: Diagnosis not present

## 2024-07-04 DIAGNOSIS — F411 Generalized anxiety disorder: Secondary | ICD-10-CM | POA: Diagnosis not present

## 2024-07-04 DIAGNOSIS — F332 Major depressive disorder, recurrent severe without psychotic features: Secondary | ICD-10-CM | POA: Diagnosis not present

## 2024-08-01 DIAGNOSIS — F411 Generalized anxiety disorder: Secondary | ICD-10-CM | POA: Diagnosis not present

## 2024-08-12 DIAGNOSIS — F411 Generalized anxiety disorder: Secondary | ICD-10-CM | POA: Diagnosis not present
